# Patient Record
Sex: Male | Born: 1990 | Race: White | Hispanic: No | Marital: Single | Smoking: Current every day smoker
Health system: Southern US, Community
[De-identification: ages and names within clinical notes are randomized; demographics above are authoritative.]

## PROBLEM LIST (undated history)

## (undated) DIAGNOSIS — J189 Pneumonia, unspecified organism: Secondary | ICD-10-CM

## (undated) DIAGNOSIS — F909 Attention-deficit hyperactivity disorder, unspecified type: Secondary | ICD-10-CM

## (undated) DIAGNOSIS — F419 Anxiety disorder, unspecified: Secondary | ICD-10-CM

## (undated) DIAGNOSIS — F329 Major depressive disorder, single episode, unspecified: Secondary | ICD-10-CM

## (undated) DIAGNOSIS — F32A Depression, unspecified: Secondary | ICD-10-CM

## (undated) DIAGNOSIS — K759 Inflammatory liver disease, unspecified: Secondary | ICD-10-CM

## (undated) DIAGNOSIS — Z87442 Personal history of urinary calculi: Secondary | ICD-10-CM

## (undated) DIAGNOSIS — F1910 Other psychoactive substance abuse, uncomplicated: Secondary | ICD-10-CM

## (undated) HISTORY — PX: OTHER SURGICAL HISTORY: SHX169

## (undated) HISTORY — DX: Other psychoactive substance abuse, uncomplicated: F19.10

---
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
## 2012-04-17 ENCOUNTER — Encounter (HOSPITAL_COMMUNITY): Payer: Self-pay | Admitting: *Deleted

## 2012-04-17 ENCOUNTER — Emergency Department (HOSPITAL_COMMUNITY)
Admission: EM | Admit: 2012-04-17 | Discharge: 2012-04-17 | Disposition: A | Discharge status: Home / Self Care | Payer: Medicaid Other | Attending: Emergency Medicine | Admitting: Emergency Medicine

## 2012-04-17 DIAGNOSIS — S39012A Strain of muscle, fascia and tendon of lower back, initial encounter: Secondary | ICD-10-CM

## 2012-04-17 DIAGNOSIS — F17200 Nicotine dependence, unspecified, uncomplicated: Secondary | ICD-10-CM | POA: Insufficient documentation

## 2012-04-17 DIAGNOSIS — M545 Low back pain, unspecified: Secondary | ICD-10-CM | POA: Insufficient documentation

## 2012-04-17 DIAGNOSIS — X500XXA Overexertion from strenuous movement or load, initial encounter: Secondary | ICD-10-CM | POA: Insufficient documentation

## 2012-04-17 DIAGNOSIS — S335XXA Sprain of ligaments of lumbar spine, initial encounter: Secondary | ICD-10-CM | POA: Insufficient documentation

## 2012-04-17 MED ORDER — IBUPROFEN 800 MG PO TABS: 800.0000 mg | ORAL_TABLET | Freq: Three times a day (TID) | ORAL | Status: AC

## 2012-04-17 MED ORDER — OXYCODONE-ACETAMINOPHEN 5-325 MG PO TABS: 1.0000 | ORAL_TABLET | ORAL | Status: AC | PRN

## 2012-04-17 MED ORDER — OXYCODONE-ACETAMINOPHEN 5-325 MG PO TABS
1.0000 | ORAL_TABLET | Freq: Once | ORAL | Status: AC
  Administered 2012-04-17: 1 via ORAL
  Filled 2012-04-17: qty 1

## 2012-04-17 MED ORDER — CYCLOBENZAPRINE HCL 10 MG PO TABS: 10.0000 mg | ORAL_TABLET | Freq: Three times a day (TID) | ORAL | Status: DC | PRN

## 2012-04-17 MED ORDER — CYCLOBENZAPRINE HCL 10 MG PO TABS
10.0000 mg | ORAL_TABLET | Freq: Once | ORAL | Status: AC
  Administered 2012-04-17: 10 mg via ORAL
  Filled 2012-04-17: qty 1

## 2012-04-17 NOTE — ED Provider Notes (Signed)
History     CSN: [id number]  Arrival date & time 04/17/12  2206   First MD Initiated Contact with Patient 04/17/12 2253      Chief Complaint  Patient presents with  . Back Pain    (Consider location/radiation/quality/duration/timing/severity/associated sxs/prior treatment) HPI Comments: Patient c/o pain to his lower back for several hours after pushing a car up a hill.  States the pain improves somewhat with rest, but has "sharp shooting pains" with movement.  He denies numbness, weakness, or incontinence.    Patient is a 21 y.o. male presenting with back pain. The history is provided by the patient.  Back Pain  This is a new problem. The current episode started 3 to 5 hours ago. The problem occurs constantly. The problem has been gradually worsening. Associated with: pushing a vehicle up a hill. The pain is present in the lumbar spine. The quality of the pain is described as shooting and aching. The pain does not radiate. The pain is moderate. The symptoms are aggravated by bending, twisting and certain positions. The pain is the same all the time. Pertinent negatives include no chest pain, no fever, no numbness, no abdominal pain, no abdominal swelling, no bowel incontinence, no perianal numbness, no bladder incontinence, no dysuria, no pelvic pain, no leg pain, no paresthesias, no paresis, no tingling and no weakness. He has tried nothing for the symptoms. The treatment provided no relief.    History reviewed. No pertinent past medical history.  History reviewed. No pertinent past surgical history.  History reviewed. No pertinent family history.  History  Substance Use Topics  . Smoking status: Current Everyday Smoker    Types: Cigarettes  . Smokeless tobacco: Not on file  . Alcohol Use: Yes      Review of Systems  Constitutional: Negative for fever.  Respiratory: Negative for shortness of breath.   Cardiovascular: Negative for chest pain.  Gastrointestinal: Negative for  vomiting, abdominal pain, constipation and bowel incontinence.  Genitourinary: Negative for bladder incontinence, dysuria, hematuria, flank pain, decreased urine volume, difficulty urinating and pelvic pain.       Also negative for perineal numbness and incontinence of urine or feces  Musculoskeletal: Positive for back pain. Negative for joint swelling.  Skin: Negative for rash.  Neurological: Negative for tingling, weakness, numbness and paresthesias.  All other systems reviewed and are negative.    Allergies  Hydrocodone  Home Medications  No current outpatient prescriptions on file.  BP 113/71  Pulse 83  Temp(Src) 97.9 F (36.6 C) (Oral)  Resp 24  Ht 5\' 10"  (1.778 m)  Wt 140 lb (63.504 kg)  BMI 20.09 kg/m2  SpO2 99%  Physical Exam  Nursing note and vitals reviewed. Constitutional: He is oriented to person, place, and time. He appears well-developed and well-nourished. No distress.  HENT:  Head: Normocephalic and atraumatic.  Neck: Normal range of motion. Neck supple.  Cardiovascular: Normal rate, regular rhythm and normal heart sounds.   No murmur heard. Pulmonary/Chest: Effort normal and breath sounds normal. No respiratory distress.  Abdominal: Soft. He exhibits no distension. There is no tenderness.  Musculoskeletal: Normal range of motion. He exhibits tenderness. He exhibits no edema.       Lumbar back: He exhibits tenderness, edema, pain and spasm. He exhibits normal range of motion, no bony tenderness, no swelling and normal pulse.       Back:       ttp of the lumbar paraspinal muscles.    Lymphadenopathy:  He has no cervical adenopathy.  Neurological: He is alert and oriented to person, place, and time. No cranial nerve deficit or sensory deficit. He exhibits normal muscle tone. Coordination and gait normal.  Reflex Scores:      Patellar reflexes are 2+ on the right side and 2+ on the left side.      Achilles reflexes are 2+ on the right side and 2+ on the  left side. Skin: Skin is warm and dry.    ED Course  Procedures (including critical care time)       MDM     Previous medical charts, nursing notes and vitals signs from this visit were reviewed by me   All laboratory results and/or imaging results performed on this visit, if applicable, were reviewed by me and discussed with the patient and/or parent as well as recommendation for follow-up    MEDICATIONS GIVEN IN ED:    Po percocet and flexeril   Patient has tenderness to palpation of the lumbar paraspinal muscles. Pain is also reproduced with bilateral straight-leg raise. No focal neuro deficits on exam. Patient ambulates with a steady gait. He agrees to followup with his primary care physician.      PRESCRIPTIONS GIVEN AT DISCHARGE:  Percocet, flexeril     Pt stable in ED with no significant deterioration in condition. Pt feels improved after observation and/or treatment in ED. Patient / Family / Caregiver understand and agree with initial ED impression and plan with expectations set for ED visit.  Patient agrees to return to ED for any worsening symptoms       Evian Derringer L. [place], [place] 04/17/12 2324

## 2012-04-17 NOTE — ED Notes (Signed)
Back pain after pushing a car off the road.

## 2012-04-17 NOTE — Discharge Instructions (Signed)

## 2012-04-18 NOTE — ED Provider Notes (Signed)
Medical screening examination/treatment/procedure(s) were performed by non-physician practitioner and as supervising physician I was immediately available for consultation/collaboration.   Vida Roller, MD 04/18/12 [phone]

## 2012-04-22 ENCOUNTER — Emergency Department (HOSPITAL_COMMUNITY): Payer: Self-pay

## 2012-04-22 ENCOUNTER — Emergency Department (HOSPITAL_COMMUNITY)
Admission: EM | Admit: 2012-04-22 | Discharge: 2012-04-22 | Disposition: A | Discharge status: Home / Self Care | Payer: Self-pay | Attending: Emergency Medicine | Admitting: Emergency Medicine

## 2012-04-22 ENCOUNTER — Encounter (HOSPITAL_COMMUNITY): Payer: Self-pay | Admitting: *Deleted

## 2012-04-22 DIAGNOSIS — S335XXA Sprain of ligaments of lumbar spine, initial encounter: Secondary | ICD-10-CM | POA: Insufficient documentation

## 2012-04-22 DIAGNOSIS — S39012A Strain of muscle, fascia and tendon of lower back, initial encounter: Secondary | ICD-10-CM

## 2012-04-22 DIAGNOSIS — X503XXA Overexertion from repetitive movements, initial encounter: Secondary | ICD-10-CM | POA: Insufficient documentation

## 2012-04-22 DIAGNOSIS — M545 Low back pain, unspecified: Secondary | ICD-10-CM | POA: Insufficient documentation

## 2012-04-22 DIAGNOSIS — M533 Sacrococcygeal disorders, not elsewhere classified: Secondary | ICD-10-CM | POA: Insufficient documentation

## 2012-04-22 MED ORDER — OXYCODONE-ACETAMINOPHEN 5-325 MG PO TABS
ORAL_TABLET | ORAL | Status: AC
Start: 2012-04-22 — End: 2012-05-02

## 2012-04-22 MED ORDER — DIAZEPAM 5 MG PO TABS
5.0000 mg | ORAL_TABLET | Freq: Once | ORAL | Status: AC
  Administered 2012-04-22: 5 mg via ORAL
  Filled 2012-04-22: qty 1

## 2012-04-22 MED ORDER — OXYCODONE-ACETAMINOPHEN 5-325 MG PO TABS
1.0000 | ORAL_TABLET | Freq: Once | ORAL | Status: AC
  Administered 2012-04-22: 1 via ORAL
  Filled 2012-04-22: qty 1

## 2012-04-22 MED ORDER — METAXALONE 800 MG PO TABS: 800.0000 mg | ORAL_TABLET | Freq: Three times a day (TID) | ORAL | Status: AC

## 2012-04-22 NOTE — ED Notes (Signed)
Pt was seen in er on Monday for the same. Out of meds & was told to return if not improved.

## 2012-04-22 NOTE — ED Provider Notes (Signed)
History    Scribed for Larry Anger, DO, the patient was seen in room APA19/APA19. This chart was scribed by Katha Cabal.   CSN: [id number]  Arrival date & time 04/22/12  2056   First MD Initiated Contact with Patient 04/22/12 2107      Chief Complaint  Patient presents with  . Back Pain     HPI Dr. Clarene Duke entered patient's room at 2110. Larry Wilson is a 21 y.o. male who presents to the Emergency Department complaining of gradual onset and persistence of constant lower back "pain" for the past 5 days. Pt states the pain began after he was "pushing a car up a hill" 5 days ago.  Pt was eval in the ED for same, rx flexeril and percocet with improvement of pain.  Has run out of those rx and the pain has returned.  States he took OTC motrin without relief.  Pain worsens with palpation of the area and certain body positions.  Denies incont/retention of bowel or bladder, no saddle anesthesia, no focal motor weakness, no tingling/numbness in extremities, no fevers, no direct or new injury.   The symptoms have been associated with no other complaints. The patient has a significant history of similar symptoms previously, recently being evaluated for this complaint.   PCP Dr Milford Cage   History reviewed. No pertinent past medical history.  History reviewed. No pertinent past surgical history.   History  Substance Use Topics  . Smoking status: Current Everyday Smoker    Types: Cigarettes  . Smokeless tobacco: Not on file  . Alcohol Use: Yes    Review of Systems ROS: Statement: All systems negative except as marked or noted in the HPI; Constitutional: Negative for fever and chills. ; ; Eyes: Negative for eye pain, redness and discharge. ; ; ENMT: Negative for ear pain, hoarseness, nasal congestion, sinus pressure and sore throat. ; ; Cardiovascular: Negative for chest pain, palpitations, diaphoresis, dyspnea and peripheral edema. ; ; Respiratory: Negative for cough, wheezing and  stridor. ; ; Gastrointestinal: Negative for nausea, vomiting, diarrhea, abdominal pain, blood in stool, hematemesis, jaundice and rectal bleeding. . ; ; Genitourinary: Negative for dysuria, flank pain and hematuria. ; ; Musculoskeletal: +LBP.  Negative for neck pain. Negative for swelling and trauma.; ; Skin: Negative for pruritus, rash, abrasions, blisters, bruising and skin lesion.; ; Neuro: Negative for headache, lightheadedness and neck stiffness. Negative for weakness, altered level of consciousness , altered mental status, extremity weakness, paresthesias, involuntary movement, seizure and syncope.      Allergies  Hydrocodone  Home Medications   Current Outpatient Rx  Name Route Sig Dispense Refill  . CYCLOBENZAPRINE HCL 10 MG PO TABS Oral Take 1 tablet (10 mg total) by mouth 3 (three) times daily as needed for muscle spasms. 21 tablet 0  . IBUPROFEN 800 MG PO TABS Oral Take 1 tablet (800 mg total) by mouth 3 (three) times daily. 21 tablet 0  . OXYCODONE-ACETAMINOPHEN 5-325 MG PO TABS Oral Take 1 tablet by mouth every 4 (four) hours as needed for pain. 12 tablet 0    BP 134/71  Pulse 88  Temp(Src) 98.9 F (37.2 C) (Oral)  Resp 16  Ht 5\' 10"  (1.778 m)  Wt 140 lb (63.504 kg)  BMI 20.09 kg/m2  SpO2 100%  Physical Exam 2115: Physical examination:  Nursing notes reviewed; Vital signs and O2 SAT reviewed;  Constitutional: Well developed, Well nourished, Well hydrated, In no acute distress; Head:  Normocephalic, atraumatic; Eyes: EOMI, PERRL,  No scleral icterus; ENMT: Mouth and pharynx normal, Mucous membranes moist; Neck: Supple, Full range of motion, No lymphadenopathy; Cardiovascular: Regular rate and rhythm, No murmur, rub, or gallop; Respiratory: Breath sounds clear & equal bilaterally, No rales, rhonchi, wheezes, or rub, Normal respiratory effort/excursion; Chest: Nontender, Movement normal; Abdomen: Soft, Nontender, Nondistended, Normal bowel sounds; Genitourinary: No CVA  tenderness; Spine:  No midline CS, TS, LS tenderness.  +TTP right lumbar paraspinal muscles and left SI joint areas.; Extremities: Pulses normal, No tenderness, No edema, No calf edema or asymmetry.; Neuro: AA&Ox3, Major CN grossly intact. Strength 5/5 equal bilat UE's and LE's, including great toe dorsiflexion.  DTR 2/4 equal bilat UE's and LE's.  No gross sensory deficits. Gait steady. Neg straight leg raises bilat..; Skin: Color normal, Warm, Dry   ED Course  Procedures    MDM  MDM Reviewed: nursing note, vitals and previous chart Interpretation: x-ray     Dg Lumbar Spine Complete 04/22/2012  *RADIOLOGY REPORT*  Clinical Data: Lower back pain after pushing car uphill.  LUMBAR SPINE - COMPLETE 4+ VIEW  Comparison: Lumbar spine radiographs performed 02/15/2011  Findings: There is no evidence of fracture or subluxation. Vertebral bodies demonstrate normal height and alignment. Intervertebral disc spaces are preserved.  The visualized neural foramina are grossly unremarkable in appearance.  The visualized bowel gas pattern is unremarkable in appearance; air and stool are noted within the colon.  The sacroiliac joints are within normal limits.  IMPRESSION: No evidence of fracture or subluxation along the lumbar spine.  Original Report Authenticated By: Tonia Ghent, M.D.     2240:  Pt has been ambulatory around the ED with steady upright gait.  Appears msk pain at this time, will tx symptomatically.  Dx testing d/w pt and family.  Questions answered.  Verb understanding, agreeable to d/c home with outpt f/u.         I personally performed the services described in this documentation, which was scribed in my presence. The recorded information has been reviewed and considered. Maher Shon Allison Quarry, DO 04/24/12 0132

## 2012-04-22 NOTE — Discharge Instructions (Signed)
RESOURCE GUIDE  Dental Problems  Patients with Medicaid: [facility]                     [facility] [address]                                           [address] Phone:  [phone]                                                  Phone:  [phone]  If unable to pay or uninsured, contact:  [facility] or [facility] to become qualified for the adult dental clinic.  Chronic Pain Problems Contact [facility]  [phone] Patients need to be referred by their primary care doctor.  Insufficient Money for Medicine Contact [employer]:  call "211" or [facility] 271-5999.  No Primary Care Doctor Call [facility]  832-8000 Other agencies that provide inexpensive medical care    [facility]  [phone]    [facility]  [phone]    [facility]  [phone]    Women's Clinic  [phone]    [facility]  [phone]    [facility]  [phone]  Psychological Services [facility]  [phone] [facility]  [phone] [facility]   800 853-5163 (emergency services 641-4993)  Substance Abuse Resources [facility]  336-882-2125 [facility] 336-784-9470 [facility] 336-285-9073 [facility] 336-845-3988 Residential & Outpatient Substance Abuse Program  800-659-3381  Abuse/Neglect [place] Child Abuse Hotline (336) 641-3795 [place] Child Abuse Hotline 800-378-5315 (After Hours)  Emergency Shelter [facility] (336) 271-5985  Maternity Homes [facility] (336) 275-9566 [facility] (704) 372-4663  MRSA Hotline #:   832-7006    [place] Resources  [facility] [place]     [employer]                          [facility] [address] [place]                       [address]      [address]  [place]                                                [place]                            [place] Phone:  [phone]                                   Phone:  [phone]                 Phone:  [phone]  [facility] Phone:  [phone]    [facility] Child Abuse Hotline 781-259-3587 740-435-1735 (After Hours)   Take the prescriptions as directed.  Continue to take over the counter ibuprofen, as directed on packaging, with food, for the next week.  Apply moist heat or ice to the area(s) of discomfort, for 15 minutes at a time, several times per day for the next few days.  Do not fall asleep on a heating or ice pack.  Call your regular medical doctor on Monday to schedule a follow up appointment this week.  Return to the Emergency Department immediately if worsening.

## 2012-04-22 NOTE — ED Notes (Signed)
Pt alert & oriented x4, stable gait. Pt given discharge instructions, paperwork & prescription(s). Patient instructed to stop at the registration desk to finish any additional paperwork. pt verbalized understanding. Pt left department w/ no further questions.  

## 2012-08-29 ENCOUNTER — Encounter (HOSPITAL_COMMUNITY): Payer: Self-pay | Admitting: *Deleted

## 2012-08-29 ENCOUNTER — Emergency Department (HOSPITAL_COMMUNITY)
Admission: EM | Admit: 2012-08-29 | Discharge: 2012-08-29 | Disposition: A | Discharge status: Home / Self Care | Payer: Self-pay | Attending: Emergency Medicine | Admitting: Emergency Medicine

## 2012-08-29 DIAGNOSIS — F17200 Nicotine dependence, unspecified, uncomplicated: Secondary | ICD-10-CM | POA: Insufficient documentation

## 2012-08-29 DIAGNOSIS — A549 Gonococcal infection, unspecified: Secondary | ICD-10-CM

## 2012-08-29 DIAGNOSIS — A5400 Gonococcal infection of lower genitourinary tract, unspecified: Secondary | ICD-10-CM | POA: Insufficient documentation

## 2012-08-29 MED ORDER — CEFTRIAXONE SODIUM 250 MG IJ SOLR
250.0000 mg | Freq: Once | INTRAMUSCULAR | Status: AC
  Administered 2012-08-29: 250 mg via INTRAMUSCULAR
  Filled 2012-08-29: qty 250

## 2012-08-29 MED ORDER — LIDOCAINE HCL (PF) 1 % IJ SOLN
INTRAMUSCULAR | Status: AC
  Filled 2012-08-29: qty 5

## 2012-08-29 MED ORDER — AZITHROMYCIN 250 MG PO TABS
1000.0000 mg | ORAL_TABLET | Freq: Once | ORAL | Status: AC
  Administered 2012-08-29: 1000 mg via ORAL
  Filled 2012-08-29: qty 4

## 2012-08-29 NOTE — ED Notes (Signed)
Pt reports that he "met a woman" while at the beach, now has milky white discharge,

## 2012-08-29 NOTE — ED Notes (Signed)
Pt with unprotected sex with a girl over a week, started with burning on urination and white discharge, denies any rashes or sores

## 2012-08-29 NOTE — ED Provider Notes (Signed)
History     CSN: [id number]  Arrival date & time 08/29/12  1408   None     Chief Complaint  Patient presents with  . Exposure to STD    (Consider location/radiation/quality/duration/timing/severity/associated sxs/prior treatment) HPI Comments: Pt had unprotected sex ~ 5 days ago.  He has been having white penile d/c and dysuria ~ 3 days.   No other sxs.  Patient is a 21 y.o. male presenting with STD exposure. The history is provided by the patient. No language interpreter was used.  Exposure to STD This is a new problem. Episode onset: 3 days ago. The problem occurs constantly. The problem has been unchanged. Pertinent negatives include no fever, joint swelling, myalgias or rash. He has tried nothing for the symptoms.    History reviewed. No pertinent past medical history.  History reviewed. No pertinent past surgical history.  No family history on file.  History  Substance Use Topics  . Smoking status: Current Everyday Smoker    Types: Cigarettes  . Smokeless tobacco: Not on file  . Alcohol Use: No      Review of Systems  Constitutional: Negative for fever.  Genitourinary: Positive for dysuria. Negative for urgency, frequency, hematuria, flank pain, penile swelling, scrotal swelling, genital sores and testicular pain.  Musculoskeletal: Negative for myalgias and joint swelling.  Skin: Negative for rash.  All other systems reviewed and are negative.    Allergies  Flexeril and Hydrocodone  Home Medications  No current outpatient prescriptions on file.  BP 137/83  Pulse 58  Temp 98.3 F (36.8 C) (Oral)  Resp 20  SpO2 100%  Physical Exam  Nursing note and vitals reviewed. Constitutional: He is oriented to person, place, and time. He appears well-developed and well-nourished.  HENT:  Head: Normocephalic and atraumatic.  Eyes: EOM are normal.  Neck: Normal range of motion.  Cardiovascular: Normal rate, regular rhythm, normal heart sounds and intact distal  pulses.   Pulmonary/Chest: Effort normal and breath sounds normal. No respiratory distress.  Abdominal: Soft. He exhibits no distension. There is no tenderness.  Genitourinary: Testes normal. Circumcised. No phimosis, paraphimosis, hypospadias, penile erythema or penile tenderness. Discharge found.       Thick white penile discharge easily milked from the meatus.  Musculoskeletal: Normal range of motion.  Neurological: He is alert and oriented to person, place, and time.  Skin: Skin is warm and dry.  Psychiatric: He has a normal mood and affect. Judgment normal.    ED Course  Procedures (including critical care time)   Labs Reviewed  GC/CHLAMYDIA PROBE AMP, GENITAL  RPR   No results found.   1. Gonorrhea       MDM  Rocephin 250 mg IM zithromax 1 gm po RPR pending.        Evalina Field, [place] 08/29/12 1645

## 2012-08-30 LAB — GC/CHLAMYDIA PROBE AMP, GENITAL
Chlamydia, DNA Probe: NEGATIVE
GC Probe Amp, Genital: NEGATIVE

## 2012-08-30 LAB — RPR: RPR Ser Ql: NONREACTIVE

## 2012-08-31 NOTE — ED Provider Notes (Signed)
Medical screening examination/treatment/procedure(s) were performed by non-physician practitioner and as supervising physician I was immediately available for consultation/collaboration.  Dezeray Puccio, MD 08/31/12 0525 

## 2013-01-06 ENCOUNTER — Emergency Department (HOSPITAL_COMMUNITY)
Admission: EM | Admit: 2013-01-06 | Discharge: 2013-01-06 | Disposition: A | Discharge status: Home / Self Care | Payer: No Typology Code available for payment source | Attending: Emergency Medicine | Admitting: Emergency Medicine

## 2013-01-06 ENCOUNTER — Encounter (HOSPITAL_COMMUNITY): Payer: Self-pay | Admitting: *Deleted

## 2013-01-06 ENCOUNTER — Emergency Department (HOSPITAL_COMMUNITY): Payer: No Typology Code available for payment source

## 2013-01-06 DIAGNOSIS — S161XXA Strain of muscle, fascia and tendon at neck level, initial encounter: Secondary | ICD-10-CM

## 2013-01-06 DIAGNOSIS — Y9229 Other specified public building as the place of occurrence of the external cause: Secondary | ICD-10-CM | POA: Insufficient documentation

## 2013-01-06 DIAGNOSIS — S139XXA Sprain of joints and ligaments of unspecified parts of neck, initial encounter: Secondary | ICD-10-CM | POA: Insufficient documentation

## 2013-01-06 DIAGNOSIS — F17200 Nicotine dependence, unspecified, uncomplicated: Secondary | ICD-10-CM | POA: Insufficient documentation

## 2013-01-06 DIAGNOSIS — Y9389 Activity, other specified: Secondary | ICD-10-CM | POA: Insufficient documentation

## 2013-01-06 MED ORDER — NAPROXEN 500 MG PO TABS: 500.0000 mg | ORAL_TABLET | Freq: Two times a day (BID) | ORAL | Status: DC

## 2013-01-06 MED ORDER — METHOCARBAMOL 500 MG PO TABS: ORAL_TABLET | ORAL | Status: DC
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
## 2013-01-10 NOTE — ED Provider Notes (Signed)
Medical screening examination/treatment/procedure(s) were performed by non-physician practitioner and as supervising physician I was immediately available for consultation/collaboration.   Ajahni Nay W Sarahanne Novakowski, MD 01/10/13 1750 

## 2013-06-24 ENCOUNTER — Emergency Department (HOSPITAL_COMMUNITY)
Admission: EM | Admit: 2013-06-24 | Discharge: 2013-06-24 | Disposition: A | Discharge status: Home / Self Care | Payer: No Typology Code available for payment source | Attending: Emergency Medicine | Admitting: Emergency Medicine

## 2013-06-24 ENCOUNTER — Encounter (HOSPITAL_COMMUNITY): Payer: Self-pay | Admitting: Emergency Medicine

## 2013-06-24 ENCOUNTER — Emergency Department (HOSPITAL_COMMUNITY): Payer: No Typology Code available for payment source

## 2013-06-24 DIAGNOSIS — Y929 Unspecified place or not applicable: Secondary | ICD-10-CM | POA: Insufficient documentation

## 2013-06-24 DIAGNOSIS — S62111A Displaced fracture of triquetrum [cuneiform] bone, right wrist, initial encounter for closed fracture: Secondary | ICD-10-CM

## 2013-06-24 DIAGNOSIS — Y939 Activity, unspecified: Secondary | ICD-10-CM | POA: Insufficient documentation

## 2013-06-24 DIAGNOSIS — IMO0002 Reserved for concepts with insufficient information to code with codable children: Secondary | ICD-10-CM | POA: Insufficient documentation

## 2013-06-24 DIAGNOSIS — Z8659 Personal history of other mental and behavioral disorders: Secondary | ICD-10-CM | POA: Insufficient documentation

## 2013-06-24 DIAGNOSIS — F17200 Nicotine dependence, unspecified, uncomplicated: Secondary | ICD-10-CM | POA: Insufficient documentation

## 2013-06-24 DIAGNOSIS — S62113A Displaced fracture of triquetrum [cuneiform] bone, unspecified wrist, initial encounter for closed fracture: Secondary | ICD-10-CM | POA: Insufficient documentation

## 2013-06-24 HISTORY — DX: Attention-deficit hyperactivity disorder, unspecified type: F90.9

## 2013-06-24 MED ORDER — OXYCODONE-ACETAMINOPHEN 5-325 MG PO TABS
1.0000 | ORAL_TABLET | Freq: Once | ORAL | Status: AC
  Administered 2013-06-24: 1 via ORAL
  Filled 2013-06-24: qty 1

## 2013-06-24 MED ORDER — OXYCODONE-ACETAMINOPHEN 5-325 MG PO TABS: 1.0000 | ORAL_TABLET | ORAL | Status: DC | PRN

## 2013-06-24 MED ORDER — IBUPROFEN 800 MG PO TABS: 800.0000 mg | ORAL_TABLET | Freq: Three times a day (TID) | ORAL | Status: DC

## 2013-06-24 NOTE — ED Provider Notes (Signed)
Medical screening examination/treatment/procedure(s) were performed by non-physician practitioner and as supervising physician I was immediately available for consultation/collaboration.   Shelda Jakes, MD 06/24/13 2006
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
## 2013-11-26 ENCOUNTER — Encounter (HOSPITAL_COMMUNITY): Payer: Self-pay | Admitting: Emergency Medicine

## 2013-11-26 ENCOUNTER — Emergency Department (HOSPITAL_COMMUNITY)
Admission: EM | Admit: 2013-11-26 | Discharge: 2013-11-26 | Disposition: A | Discharge status: Home / Self Care | Payer: Self-pay | Attending: Emergency Medicine | Admitting: Emergency Medicine

## 2013-11-26 DIAGNOSIS — G8929 Other chronic pain: Secondary | ICD-10-CM | POA: Insufficient documentation

## 2013-11-26 DIAGNOSIS — M546 Pain in thoracic spine: Secondary | ICD-10-CM | POA: Insufficient documentation

## 2013-11-26 DIAGNOSIS — R42 Dizziness and giddiness: Secondary | ICD-10-CM | POA: Insufficient documentation

## 2013-11-26 DIAGNOSIS — Z8659 Personal history of other mental and behavioral disorders: Secondary | ICD-10-CM | POA: Insufficient documentation

## 2013-11-26 DIAGNOSIS — R6883 Chills (without fever): Secondary | ICD-10-CM | POA: Insufficient documentation

## 2013-11-26 DIAGNOSIS — F17200 Nicotine dependence, unspecified, uncomplicated: Secondary | ICD-10-CM | POA: Insufficient documentation

## 2013-11-26 DIAGNOSIS — Z87828 Personal history of other (healed) physical injury and trauma: Secondary | ICD-10-CM | POA: Insufficient documentation

## 2013-11-26 DIAGNOSIS — M25519 Pain in unspecified shoulder: Secondary | ICD-10-CM | POA: Insufficient documentation

## 2013-11-26 MED ORDER — METHOCARBAMOL 500 MG PO TABS: 500.0000 mg | ORAL_TABLET | Freq: Two times a day (BID) | ORAL | Status: DC

## 2013-11-26 MED ORDER — NAPROXEN 500 MG PO TABS: 500.0000 mg | ORAL_TABLET | Freq: Two times a day (BID) | ORAL | Status: DC

## 2013-11-26 MED ORDER — OXYCODONE-ACETAMINOPHEN 5-325 MG PO TABS
1.0000 | ORAL_TABLET | Freq: Once | ORAL | Status: AC
  Administered 2013-11-26: 1 via ORAL
  Filled 2013-11-26: qty 1
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
## 2013-11-26 NOTE — ED Notes (Signed)
Pain under lt scapula for 3 years. Says he has been seen here for same and the pain always comes back.  Last 3 days the pain in worse

## 2013-11-26 NOTE — ED Notes (Addendum)
Pt reporting back pain, under left shoulder, for past several days. Pt has experienced pain in same area previously.  Reports no relief from 800 mg Ibuprofen taken earlier, or Tramadol taken last night.

## 2013-11-28 NOTE — ED Provider Notes (Signed)
Medical screening examination/treatment/procedure(s) were performed by non-physician practitioner and as supervising physician I was immediately available for consultation/collaboration.  EKG Interpretation   None         Chareese Sergent M Urijah Raynor, DO 11/28/13 1735 

## 2014-01-12 ENCOUNTER — Emergency Department (HOSPITAL_COMMUNITY): Payer: Self-pay

## 2014-01-12 ENCOUNTER — Emergency Department (HOSPITAL_COMMUNITY)
Admission: EM | Admit: 2014-01-12 | Discharge: 2014-01-12 | Disposition: A | Discharge status: Home / Self Care | Payer: Self-pay | Attending: Emergency Medicine | Admitting: Emergency Medicine

## 2014-01-12 ENCOUNTER — Encounter (HOSPITAL_COMMUNITY): Payer: Self-pay | Admitting: Emergency Medicine

## 2014-01-12 DIAGNOSIS — Z8659 Personal history of other mental and behavioral disorders: Secondary | ICD-10-CM | POA: Insufficient documentation

## 2014-01-12 DIAGNOSIS — J209 Acute bronchitis, unspecified: Secondary | ICD-10-CM | POA: Insufficient documentation

## 2014-01-12 DIAGNOSIS — F17200 Nicotine dependence, unspecified, uncomplicated: Secondary | ICD-10-CM | POA: Insufficient documentation

## 2014-01-12 DIAGNOSIS — R5381 Other malaise: Secondary | ICD-10-CM | POA: Insufficient documentation

## 2014-01-12 DIAGNOSIS — Z792 Long term (current) use of antibiotics: Secondary | ICD-10-CM | POA: Insufficient documentation

## 2014-01-12 DIAGNOSIS — R5383 Other fatigue: Secondary | ICD-10-CM

## 2014-01-12 MED ORDER — BENZONATATE 100 MG PO CAPS
200.0000 mg | ORAL_CAPSULE | Freq: Once | ORAL | Status: AC
  Administered 2014-01-12: 200 mg via ORAL
  Filled 2014-01-12: qty 2

## 2014-01-12 MED ORDER — AZITHROMYCIN 250 MG PO TABS
500.0000 mg | ORAL_TABLET | Freq: Once | ORAL | Status: AC
  Administered 2014-01-12: 500 mg via ORAL
  Filled 2014-01-12: qty 2

## 2014-01-12 MED ORDER — PROMETHAZINE-CODEINE 6.25-10 MG/5ML PO SYRP: 5.0000 mL | ORAL_SOLUTION | ORAL | Status: DC | PRN

## 2014-01-12 MED ORDER — BENZONATATE 100 MG PO CAPS: 200.0000 mg | ORAL_CAPSULE | Freq: Three times a day (TID) | ORAL | Status: DC | PRN

## 2014-01-12 MED ORDER — HYDROCOD POLST-CHLORPHEN POLST 10-8 MG/5ML PO LQCR: 5.0000 mL | Freq: Two times a day (BID) | ORAL | Status: DC | PRN

## 2014-01-12 MED ORDER — AZITHROMYCIN 250 MG PO TABS: ORAL_TABLET | ORAL | Status: DC

## 2014-01-12 NOTE — Discharge Instructions (Signed)
Acute Bronchitis Bronchitis is inflammation of the airways that extend from the windpipe into the lungs (bronchi). The inflammation often causes mucus to develop. This leads to a cough, which is the most common symptom of bronchitis.  In acute bronchitis, the condition usually develops suddenly and goes away over time, usually in a couple weeks. Smoking, allergies, and asthma can make bronchitis worse. Repeated episodes of bronchitis may cause further lung problems.  CAUSES Acute bronchitis is most often caused by the same virus that causes a cold. The virus can spread from person to person (contagious).  SIGNS AND SYMPTOMS   Cough.   Fever.   Coughing up mucus.   Body aches.   Chest congestion.   Chills.   Shortness of breath.   Sore throat.  DIAGNOSIS  Acute bronchitis is usually diagnosed through a physical exam. Tests, such as chest X-rays, are sometimes done to rule out other conditions.  TREATMENT  Acute bronchitis usually goes away in a couple weeks. Often times, no medical treatment is necessary. Medicines are sometimes given for relief of fever or cough. Antibiotics are usually not needed but may be prescribed in certain situations. In some cases, an inhaler may be recommended to help reduce shortness of breath and control the cough. A cool mist vaporizer may also be used to help thin bronchial secretions and make it easier to clear the chest.  HOME CARE INSTRUCTIONS  Get plenty of rest.   Drink enough fluids to keep your urine clear or pale yellow (unless you have a medical condition that requires fluid restriction). Increasing fluids may help thin your secretions and will prevent dehydration.   Only take over-the-counter or prescription medicines as directed by your health care provider.   Avoid smoking and secondhand smoke. Exposure to cigarette smoke or irritating chemicals will make bronchitis worse. If you are a smoker, consider using nicotine gum or skin  patches to help control withdrawal symptoms. Quitting smoking will help your lungs heal faster.   Reduce the chances of another bout of acute bronchitis by washing your hands frequently, avoiding people with cold symptoms, and trying not to touch your hands to your mouth, nose, or eyes.   Follow up with your health care provider as directed.  SEEK MEDICAL CARE IF: Your symptoms do not improve after 1 week of treatment.  SEEK IMMEDIATE MEDICAL CARE IF:  You develop an increased fever or chills.   You have chest pain.   You have severe shortness of breath.  You have bloody sputum.   You develop dehydration.  You develop fainting.  You develop repeated vomiting.  You develop a severe headache. MAKE SURE YOU:   Understand these instructions.  Will watch your condition.  Will get help right away if you are not doing well or get worse. Document Released: 01/13/2005 Document Revised: 08/08/2013 Document Reviewed: 05/29/2013 [employer] Patient Information 2014 [employer].  

## 2014-01-12 NOTE — ED Notes (Signed)
Pt c/o fever, cough, sore throat x3 days. Cough is productive with green sputum.

## 2014-01-15 NOTE — ED Provider Notes (Signed)
CSN: [id number]     Arrival date & time 01/12/14  1710 History   First MD Initiated Contact with Patient 01/12/14 1728     Chief Complaint  Patient presents with  . Fever  . Cough   (Consider location/radiation/quality/duration/timing/severity/associated sxs/prior Treatment) HPI Comments: Larry Wilson is a 23 y.o. Male presenting with a 3 day history of  uri type symptoms which includes nasal congestion with fever and chills, clear rhinorrhea,fatigue, cough productive of green sputum and sore throat. His symptoms started gradually over the past 3 days. Symptoms due to not include shortness of breath, chest pain,  Nausea, vomiting or diarrhea.  The patient has taken ibuprofen with minimal symptom relief.  The history is provided by the patient.    Past Medical History  Diagnosis Date  . ADHD (attention deficit hyperactivity disorder)    History reviewed. No pertinent past surgical history. History reviewed. No pertinent family history. History  Substance Use Topics  . Smoking status: Current Every Day Smoker -- 1.00 packs/day    Types: Cigarettes  . Smokeless tobacco: Not on file  . Alcohol Use: Yes     Comment: social    Review of Systems  Constitutional: Positive for fever, chills and fatigue.  HENT: Positive for congestion, rhinorrhea and sore throat. Negative for ear pain, sinus pressure, trouble swallowing and voice change.   Eyes: Negative for discharge.  Respiratory: Positive for cough. Negative for shortness of breath, wheezing and stridor.   Cardiovascular: Negative for chest pain.  Gastrointestinal: Negative for abdominal pain.  Genitourinary: Negative.     Allergies  Flexeril; Hydrocodone; and Tramadol  Home Medications   Current Outpatient Rx  Name  Route  Sig  Dispense  Refill  . ibuprofen (ADVIL,MOTRIN) 600 MG tablet   Oral   Take 600 mg by mouth every 6 (six) hours as needed.         Marland Kitchen. azithromycin (ZITHROMAX) 250 MG tablet      Take one tablet  daily.   4 tablet   0   . benzonatate (TESSALON) 100 MG capsule   Oral   Take 2 capsules (200 mg total) by mouth 3 (three) times daily as needed for cough.   30 capsule   0   . chlorpheniramine-HYDROcodone (TUSSIONEX PENNKINETIC ER) 10-8 MG/5ML LQCR   Oral   Take 5 mLs by mouth every 12 (twelve) hours as needed for cough.   50 mL   0    BP 135/75  Pulse 109  Temp(Src) 99.1 F (37.3 C) (Oral)  Resp 24  SpO2 99% Physical Exam  Constitutional: He is oriented to person, place, and time. He appears well-developed and well-nourished.  HENT:  Head: Normocephalic and atraumatic.  Right Ear: Tympanic membrane and ear canal normal.  Left Ear: Tympanic membrane and ear canal normal.  Nose: Mucosal edema and rhinorrhea present.  Mouth/Throat: Uvula is midline, oropharynx is clear and moist and mucous membranes are normal. No oropharyngeal exudate, posterior oropharyngeal edema, posterior oropharyngeal erythema or tonsillar abscesses.  Eyes: Conjunctivae are normal.  Cardiovascular: Normal rate and normal heart sounds.   Pulmonary/Chest: Effort normal. No respiratory distress. He has no decreased breath sounds. He has no wheezes. He has no rhonchi. He has no rales.  Course breath sounds throughout all lung fields.  Abdominal: Soft. There is no tenderness.  Musculoskeletal: Normal range of motion.  Neurological: He is alert and oriented to person, place, and time.  Skin: Skin is warm and dry. No rash noted.  Psychiatric: He has a normal mood and affect.    ED Course  Procedures (including critical care time) Labs Review Labs Reviewed - No data to display Imaging Review No results found.  EKG Interpretation   None       MDM   1. Bronchitis, acute    Patients labs and/or radiological studies were viewed and considered during the medical decision making and disposition process.  Pt with viral bronchitis/ viral syndrome per sx and history.  Possible influenza, although sx  have been present for 3+ days, tamiflu of doubtful benefit. Prescribed zithromax given respiratory involvement in this heavy smoker.  Discussed smoking cessation.  Tessalon and tussionex prescribed for cough, encouraged rest, increased fluids, recheck for worsened sx.     Burgess Amor, PA-C 01/15/14 2111

## 2014-01-17 NOTE — ED Provider Notes (Signed)
Medical screening examination/treatment/procedure(s) were performed by non-physician practitioner and as supervising physician I was immediately available for consultation/collaboration.  EKG Interpretation   None         Eleftheria Taborn L Esme Freund, MD 01/17/14 1111 

## 2014-01-29 ENCOUNTER — Emergency Department (HOSPITAL_COMMUNITY)
Admission: EM | Admit: 2014-01-29 | Discharge: 2014-01-29 | Disposition: A | Discharge status: Home / Self Care | Payer: Self-pay | Attending: Emergency Medicine | Admitting: Emergency Medicine

## 2014-01-29 ENCOUNTER — Encounter (HOSPITAL_COMMUNITY): Payer: Self-pay | Admitting: Emergency Medicine

## 2014-01-29 DIAGNOSIS — F411 Generalized anxiety disorder: Secondary | ICD-10-CM | POA: Insufficient documentation

## 2014-01-29 DIAGNOSIS — A630 Anogenital (venereal) warts: Secondary | ICD-10-CM | POA: Insufficient documentation

## 2014-01-29 DIAGNOSIS — F17200 Nicotine dependence, unspecified, uncomplicated: Secondary | ICD-10-CM | POA: Insufficient documentation

## 2014-01-29 DIAGNOSIS — R450 Nervousness: Secondary | ICD-10-CM | POA: Insufficient documentation

## 2014-01-29 HISTORY — DX: Anxiety disorder, unspecified: F41.9

## 2014-01-29 HISTORY — DX: Major depressive disorder, single episode, unspecified: F32.9

## 2014-01-29 HISTORY — DX: Depression, unspecified: F32.A

## 2014-01-29 MED ORDER — LIDOCAINE HCL (PF) 1 % IJ SOLN
INTRAMUSCULAR | Status: AC
  Administered 2014-01-29: 2.1 mL
  Filled 2014-01-29: qty 5

## 2014-01-29 MED ORDER — CEFTRIAXONE SODIUM 250 MG IJ SOLR
250.0000 mg | Freq: Once | INTRAMUSCULAR | Status: AC
  Administered 2014-01-29: 250 mg via INTRAMUSCULAR
  Filled 2014-01-29: qty 250

## 2014-01-29 MED ORDER — AZITHROMYCIN 250 MG PO TABS
1000.0000 mg | ORAL_TABLET | Freq: Once | ORAL | Status: AC
  Administered 2014-01-29: 1000 mg via ORAL
  Filled 2014-01-29: qty 4

## 2014-01-29 NOTE — ED Notes (Signed)
Per patient genital wart at head of penis with itching. Denies any discharge or pain with urination.

## 2014-01-29 NOTE — Discharge Instructions (Signed)
Your examination today suggests a possible wart on the penis. Please see physician's at the health department for full evaluation for possible sexually transmitted diseases. You were treated today with Rocephin and Zithromax to cover any possible gonorrhea or Chlamydia infection. Please refrain from sexual intercourse until you're testing has been completed. Genital Warts Genital warts are caused by a germ (human papillomavirus, HPV). This germ is spread by having unprotected sex (intercourse) with an infected person. It can be spread by vaginal, anal, and oral sex. A person who is infected might not show any signs or problems. HOME CARE  Follow your doctor's treatment instructions.  Do not use medicine that is meant for hand warts. Only take medicine as told by your doctor.  Tell your past and current sex partner(s) that you have genital warts. They may need treatment.  Avoid sexual contact while you are being treated.  Do not touch or scratch the warts. You could spread it to other parts of your body.  Women with genital warts should have a cervical cancer check (Pap test) at least once a year.  Tell your doctor if you become pregnant. The germ can be passed to the baby.  After treatment, use a condom during sex.  Ask your doctor before using anti-itch creams. GET HELP RIGHT AWAY IF:   Your treated skin becomes red, puffy (swollen), or painful.  You have a fever.  You feel generally sick.  You feel little lumps in and around your genital area.  You are bleeding or have pain during sex. MAKE SURE YOU:   Understand these instructions.  Will watch your condition.  Will get help right away if you are not doing well or get worse. Document Released: 03/02/2010 Document Revised: 02/28/2012 Document Reviewed: 06/14/2011 [facility] Patient Information 2014 [place]ExitCare, [place]LLC.

## 2014-01-29 NOTE — ED Provider Notes (Signed)
CSN: [id number]     Arrival date & time 01/29/14  1324 History   First MD Initiated Contact with Patient 01/29/14 [phone]     Chief Complaint  Patient presents with  . SEXUALLY TRANSMITTED DISEASE     (Consider location/radiation/quality/duration/timing/severity/associated sxs/prior Treatment) HPI Comments:  Patient is a 23 year old male who presents to the emergency department with complaint of exposure to sexually transmitted disease. The patient states that he has been dating his current partner for approximately 2 months. Approximately 3 or 4 days ago he noticed a rash that he believes to be a wart on the head of his penis. He states that he has a lot of itching of the head of the penis from time to time. He has not had any drainage or discharge from the rash area. There's been no drainage or discharge from the penis. There is no pain in this pain, there is no testicular pain. There no other changes of the genitalia. Patient states he has never had this before.   The history is provided by the patient.    Past Medical History  Diagnosis Date  . ADHD (attention deficit hyperactivity disorder)   . Depression   . Anxiety    History reviewed. No pertinent past surgical history. Family History  Problem Relation Age of Onset  . Diabetes Other    History  Substance Use Topics  . Smoking status: Current Every Day Smoker -- 0.75 packs/day for 5 years    Types: Cigarettes  . Smokeless tobacco: Never Used  . Alcohol Use: Not on file    Review of Systems  Constitutional: Negative for activity change.       All ROS Neg except as noted in HPI  HENT: Negative for nosebleeds.   Eyes: Negative for photophobia and discharge.  Respiratory: Negative for cough, shortness of breath and wheezing.   Cardiovascular: Negative for chest pain and palpitations.  Gastrointestinal: Negative for abdominal pain and blood in stool.  Genitourinary: Negative for dysuria, frequency and hematuria.   Musculoskeletal: Negative for arthralgias, back pain and neck pain.  Skin: Negative.   Neurological: Negative for dizziness, seizures and speech difficulty.  Psychiatric/Behavioral: Negative for hallucinations and confusion. The patient is nervous/anxious.       Allergies  Flexeril; Hydrocodone; and Tramadol  Home Medications  No current outpatient prescriptions on file. BP 138/79  Pulse 90  Temp(Src) 98.3 F (36.8 C) (Oral)  Resp 16  Ht 5\' 10"  (1.778 m)  Wt 135 lb (61.236 kg)  BMI 19.37 kg/m2  SpO2 98% Physical Exam  Nursing note and vitals reviewed. Constitutional: He is oriented to person, place, and time. He appears well-developed and well-nourished.  Non-toxic appearance.  HENT:  Head: Normocephalic.  Right Ear: Tympanic membrane and external ear normal.  Left Ear: Tympanic membrane and external ear normal.  Eyes: EOM and lids are normal. Pupils are equal, round, and reactive to light.  Neck: Normal range of motion. Neck supple. Carotid bruit is not present.  Cardiovascular: Normal rate, regular rhythm, normal heart sounds, intact distal pulses and normal pulses.   Pulmonary/Chest: Breath sounds normal. No respiratory distress.  Abdominal: Soft. Bowel sounds are normal. There is no tenderness. There is no guarding.  Genitourinary:  There is a small red raised fleshy type growth at the tip of the penis just above the urethral opening. There is no red streaking from this there is no drainage from this area.  There are few shotty nodes in the inguinal area. There is  no tenderness of the testicles on. Is no scrotal rash or swelling.  Musculoskeletal: Normal range of motion.  Lymphadenopathy:       Head (right side): No submandibular adenopathy present.       Head (left side): No submandibular adenopathy present.    He has no cervical adenopathy.  Neurological: He is alert and oriented to person, place, and time. He has normal strength. No cranial nerve deficit or sensory  deficit.  Skin: Skin is warm and dry.  Psychiatric: He has a normal mood and affect. His speech is normal.    ED Course  Procedures (including critical care time) Labs Review Labs Reviewed  GC/CHLAMYDIA PROBE AMP   Imaging Review No results found.  EKG Interpretation   None       MDM   Final diagnoses:  None    *I have reviewed nursing notes, vital signs, and all appropriate lab and imaging results for this patient.**  The examination is consistent with a gentle wart. I have discussed this with the patient. Doubt herpetic lesion, as the patient does not have clusters of lesions. There is no active drainage or discharge from the penis. There is no rash in other areas of the genitalia.  The plan at this time is to cover the patient for possible gonorrhea Chlamydia. Culture sent to the lab. Patient referred to the health department for additional evaluation and testing.  Kathie DikeHobson M Kaytelyn Glore, PA-C 01/29/14 1630

## 2014-01-30 LAB — GC/CHLAMYDIA PROBE AMP
CT PROBE, AMP APTIMA: NEGATIVE
GC Probe RNA: NEGATIVE

## 2014-01-30 NOTE — ED Provider Notes (Signed)
Medical screening examination/treatment/procedure(s) were performed by non-physician practitioner and as supervising physician I was immediately available for consultation/collaboration.  EKG Interpretation   None        Gilda Creasehristopher J. Pollina, MD 01/30/14 [phone]

## 2014-07-25 ENCOUNTER — Emergency Department (HOSPITAL_COMMUNITY): Payer: Self-pay

## 2014-07-25 ENCOUNTER — Emergency Department (HOSPITAL_COMMUNITY)
Admission: EM | Admit: 2014-07-25 | Discharge: 2014-07-25 | Disposition: A | Discharge status: Home / Self Care | Payer: Self-pay | Attending: Emergency Medicine | Admitting: Emergency Medicine

## 2014-07-25 ENCOUNTER — Encounter (HOSPITAL_COMMUNITY): Payer: Self-pay | Admitting: Emergency Medicine

## 2014-07-25 DIAGNOSIS — F17200 Nicotine dependence, unspecified, uncomplicated: Secondary | ICD-10-CM | POA: Insufficient documentation

## 2014-07-25 DIAGNOSIS — IMO0002 Reserved for concepts with insufficient information to code with codable children: Secondary | ICD-10-CM | POA: Insufficient documentation

## 2014-07-25 DIAGNOSIS — Y929 Unspecified place or not applicable: Secondary | ICD-10-CM | POA: Insufficient documentation

## 2014-07-25 DIAGNOSIS — S62319A Displaced fracture of base of unspecified metacarpal bone, initial encounter for closed fracture: Secondary | ICD-10-CM | POA: Insufficient documentation

## 2014-07-25 DIAGNOSIS — S6990XA Unspecified injury of unspecified wrist, hand and finger(s), initial encounter: Secondary | ICD-10-CM | POA: Insufficient documentation

## 2014-07-25 DIAGNOSIS — Y9389 Activity, other specified: Secondary | ICD-10-CM | POA: Insufficient documentation

## 2014-07-25 DIAGNOSIS — S6291XA Unspecified fracture of right wrist and hand, initial encounter for closed fracture: Secondary | ICD-10-CM

## 2014-07-25 DIAGNOSIS — F329 Major depressive disorder, single episode, unspecified: Secondary | ICD-10-CM | POA: Insufficient documentation

## 2014-07-25 DIAGNOSIS — F3289 Other specified depressive episodes: Secondary | ICD-10-CM | POA: Insufficient documentation

## 2014-07-25 DIAGNOSIS — F411 Generalized anxiety disorder: Secondary | ICD-10-CM | POA: Insufficient documentation

## 2014-07-25 MED ORDER — OXYCODONE-ACETAMINOPHEN 5-325 MG PO TABS
1.0000 | ORAL_TABLET | Freq: Once | ORAL | Status: AC
  Administered 2014-07-25: 1 via ORAL
  Filled 2014-07-25: qty 1

## 2014-07-25 MED ORDER — OXYCODONE-ACETAMINOPHEN 5-325 MG PO TABS: 1.0000 | ORAL_TABLET | Freq: Four times a day (QID) | ORAL | Status: DC | PRN

## 2014-07-25 MED ORDER — IBUPROFEN 800 MG PO TABS
800.0000 mg | ORAL_TABLET | Freq: Once | ORAL | Status: AC
  Administered 2014-07-25: 800 mg via ORAL
  Filled 2014-07-25: qty 1

## 2014-07-25 NOTE — ED Notes (Signed)
Pt c/o right hand pain. States he was upset and hit a Biomedical scientistdumpster.

## 2014-07-25 NOTE — Discharge Instructions (Signed)
A fracture of your fifth metacarpal. It is important that you see an orthopedic surgeon concerning this as sone as possible. Please apply ice. Please use your sling. Please do not remove the splint until seen by orthopedics. Please use ibuprofen with each meal and bedtime for inflammation. May use Percocet for pain if needed. Percocet may cause drowsiness, and/or constipation. Please use with caution. Boxer's Fracture You have a break (fracture) of the fifth metacarpal bone. This is commonly called a boxer's fracture. This is the bone in the hand where the little finger attaches. The fracture is in the end of that bone, closest to the little finger. It is usually caused when you hit an object with a clenched fist. Often, the knuckle is pushed down by the impact. Sometimes, the fracture rotates out of position. A boxer's fracture will usually heal within 6 weeks, if it is treated properly and protected from re-injury. Surgery is sometimes needed. A cast, splint, or bulky hand dressing may be used to protect and immobilize a boxer's fracture. Do not remove this device or dressing until your caregiver approves. Keep your hand elevated, and apply ice packs for 15-20 minutes every 2 hours, for the first 2 days. Elevation and ice help reduce swelling and relieve pain. See your caregiver, or an orthopedic specialist, for follow-up care within the next 10 days. This is to make sure your fracture is healing properly. Document Released: 12/06/2005 Document Revised: 02/28/2012 Document Reviewed: 05/26/2007 [facility] Patient Information 2015 [place], [place]LLC. This information is not intended to replace advice given to you by your health care provider. Make sure you discuss any questions you have with your health care provider.

## 2014-07-25 NOTE — ED Notes (Signed)
Pt here with r hand pain. States he thinks he broke it. Stated he punched a dumpster.

## 2014-07-25 NOTE — ED Provider Notes (Signed)
CSN: [id number]     Arrival date & time 07/25/14  1902 History   First MD Initiated Contact with Patient 07/25/14 2020     Chief Complaint  Patient presents with  . Hand Injury     (Consider location/radiation/quality/duration/timing/severity/associated sxs/prior Treatment) HPI Comments: Patient is a 23 year old male who presents to the emergency department with right hand pain. The patient states that he became upset over a family situation in a dumpster. Shortly after that he had severe pain involving the right hand, particularly over the area of the ring finger and small finger area. He noticed some swelling. He now has problem using the hand and presents to the emergency department for evaluation. He's not had any previous operations or procedures involving his hand. The patient is right-hand dominant.  The history is provided by the patient.    Past Medical History  Diagnosis Date  . ADHD (attention deficit hyperactivity disorder)   . Depression   . Anxiety    History reviewed. No pertinent past surgical history. Family History  Problem Relation Age of Onset  . Diabetes Other    History  Substance Use Topics  . Smoking status: Current Every Day Smoker -- 0.75 packs/day for 5 years    Types: Cigarettes  . Smokeless tobacco: Never Used  . Alcohol Use: Yes    Review of Systems  Constitutional: Negative for activity change.       All ROS Neg except as noted in HPI  HENT: Negative for nosebleeds.   Eyes: Negative for photophobia and discharge.  Respiratory: Negative for cough, shortness of breath and wheezing.   Cardiovascular: Negative for chest pain and palpitations.  Gastrointestinal: Negative for abdominal pain and blood in stool.  Genitourinary: Negative for dysuria, frequency and hematuria.  Musculoskeletal: Negative for arthralgias, back pain and neck pain.  Skin: Negative.   Neurological: Negative for dizziness, seizures and speech difficulty.   Psychiatric/Behavioral: Negative for hallucinations and confusion. The patient is nervous/anxious.        Depression      Allergies  Flexeril; Hydrocodone; and Tramadol  Home Medications   Prior to Admission medications   Medication Sig Start Date End Date Taking? Authorizing Provider  oxyCODONE-acetaminophen (PERCOCET/ROXICET) 5-325 MG per tablet Take 1 tablet by mouth every 6 (six) hours as needed. 07/25/14   Kathie DikeHobson M Ragna Kramlich, PA-C   BP 136/90  Pulse 89  Temp(Src) 99.1 F (37.3 C) (Oral)  Resp 18  Ht 5\' 10"  (1.778 m)  Wt 134 lb (60.782 kg)  BMI 19.23 kg/m2  SpO2 100% Physical Exam  Nursing note and vitals reviewed. Constitutional: He is oriented to person, place, and time. He appears well-developed and well-nourished.  Non-toxic appearance.  HENT:  Head: Normocephalic.  Right Ear: Tympanic membrane and external ear normal.  Left Ear: Tympanic membrane and external ear normal.  Eyes: EOM and lids are normal. Pupils are equal, round, and reactive to light.  Neck: Normal range of motion. Neck supple. Carotid bruit is not present.  Cardiovascular: Normal rate, regular rhythm, normal heart sounds, intact distal pulses and normal pulses.   Pulmonary/Chest: Breath sounds normal. No respiratory distress.  Abdominal: Soft. Bowel sounds are normal. There is no tenderness. There is no guarding.  Musculoskeletal: Normal range of motion.  There is pain to palpation of the right fourth and fifth metacarpal. More pain at the base of the fifth metacarpal. There is mild-to-moderate swelling of the dorsum of the right hand. There is a very shallow abrasion of the  fourth MP joint area. Capillary refill is less than 2 seconds. Radial pulses 2+. There is full range of motion of the thumb. There is no pain in the anatomical snuff box. Is good range of motion of the elbow and shoulder on the right.  Lymphadenopathy:       Head (right side): No submandibular adenopathy present.       Head (left  side): No submandibular adenopathy present.    He has no cervical adenopathy.  Neurological: He is alert and oriented to person, place, and time. He has normal strength. No cranial nerve deficit or sensory deficit.  Skin: Skin is warm and dry.  Psychiatric: He has a normal mood and affect. His speech is normal.    ED Course  Procedures (including critical care time) Labs Review Labs Reviewed - No data to display  Imaging Review Dg Hand Complete Right  07/25/2014   CLINICAL DATA:  Patient hit dumpster today with right hand, pain  EXAM: RIGHT HAND - COMPLETE 3+ VIEW  COMPARISON:  06/24/2013  FINDINGS: There is focal soft tissue swelling over the fifth metacarpophalangeal joint. No displaced fractures identified. Subtle lucency over the metacarpal head could represent a hairline fracture or nutrient foramen.  IMPRESSION: Hairline fracture versus nutrient foramen fifth metacarpal had   Electronically Signed   By: Esperanza Heir M.D.   On: 07/25/2014 19:46     EKG Interpretation None      MDM X-ray of the right hand reveals a hairline fracture of the fifth metacarpal bone. The patient was fitted with an ulnar gutter splint and sling. Ice pack was applied. A prescription for Percocet 5 mg one every 6 hours given to the patient.  Patient referred to Dr. Romeo Apple for orthopedic evaluation. I've discussed with the patient the importance of orthopedic followup and management particularly given that he is right-hand dominant. The patient he knowledge is understanding of these instructions.    Final diagnoses:  Hand fracture, right, closed, initial encounter    **I have reviewed nursing notes, vital signs, and all appropriate lab and imaging results for this patient.Kathie Dike, PA-C 07/26/14 1211

## 2014-07-26 NOTE — ED Provider Notes (Signed)
Medical screening examination/treatment/procedure(s) were performed by non-physician practitioner and as supervising physician I was immediately available for consultation/collaboration.   EKG Interpretation None       Glynn OctaveStephen Orel Hord, MD 07/26/14 1721

## 2014-08-08 ENCOUNTER — Emergency Department (HOSPITAL_COMMUNITY)
Admission: EM | Admit: 2014-08-08 | Discharge: 2014-08-08 | Disposition: A | Discharge status: Home / Self Care | Payer: Self-pay | Attending: Emergency Medicine | Admitting: Emergency Medicine

## 2014-08-08 ENCOUNTER — Emergency Department (HOSPITAL_COMMUNITY): Payer: Self-pay

## 2014-08-08 ENCOUNTER — Encounter (HOSPITAL_COMMUNITY): Payer: Self-pay | Admitting: Emergency Medicine

## 2014-08-08 DIAGNOSIS — Y929 Unspecified place or not applicable: Secondary | ICD-10-CM | POA: Insufficient documentation

## 2014-08-08 DIAGNOSIS — S6391XA Sprain of unspecified part of right wrist and hand, initial encounter: Secondary | ICD-10-CM

## 2014-08-08 DIAGNOSIS — F17200 Nicotine dependence, unspecified, uncomplicated: Secondary | ICD-10-CM | POA: Insufficient documentation

## 2014-08-08 DIAGNOSIS — R296 Repeated falls: Secondary | ICD-10-CM | POA: Insufficient documentation

## 2014-08-08 DIAGNOSIS — S6990XA Unspecified injury of unspecified wrist, hand and finger(s), initial encounter: Secondary | ICD-10-CM | POA: Insufficient documentation

## 2014-08-08 DIAGNOSIS — S6390XA Sprain of unspecified part of unspecified wrist and hand, initial encounter: Secondary | ICD-10-CM | POA: Insufficient documentation

## 2014-08-08 DIAGNOSIS — Y9389 Activity, other specified: Secondary | ICD-10-CM | POA: Insufficient documentation

## 2014-08-08 DIAGNOSIS — Z8659 Personal history of other mental and behavioral disorders: Secondary | ICD-10-CM | POA: Insufficient documentation

## 2014-08-08 DIAGNOSIS — Z791 Long term (current) use of non-steroidal anti-inflammatories (NSAID): Secondary | ICD-10-CM | POA: Insufficient documentation

## 2014-08-08 MED ORDER — NAPROXEN 500 MG PO TABS
500.0000 mg | ORAL_TABLET | Freq: Two times a day (BID) | ORAL | Status: DC
Start: 2014-08-08 — End: 2014-09-22

## 2014-08-08 NOTE — ED Notes (Signed)
PA in with pt 

## 2014-08-08 NOTE — Care Management Note (Signed)
ED/CM noted patient did not have health insurance and/or PCP listed in the computer.  Patient was given the [place] resource handout with information on the clinics, food pantries, and the handout for new health insurance sign-up.  Patient expressed appreciation for information received. Pt was also given a Rx discount card.   

## 2014-08-08 NOTE — ED Notes (Signed)
Pt reports was seen for same x2 weeks ago and was diagnosed with fractured right hand. Pt denies being able to follow-up with orthopedic. Pt reports continued pain with no relief from otc ibuprofen. Pt reports fell and landed on right hand x3 days ago. No obvious deformity or swelling noted in triage. nad noted.

## 2014-08-08 NOTE — Discharge Instructions (Signed)
Ligament Sprain °A ligament sprain is when the bands of tissue that hold bones together (ligament) are stretched. °HOME CARE  °· Rest the injured area. °· Start using the joint when told to by your doctor. °· Keep the injured area raised (elevated) above the level of the heart. This may lessen puffiness (swelling). °· Put ice on the injured area. °¨ Put ice in a plastic bag. °¨ Place a towel between your skin and the bag. °¨ Leave the ice on for 15-20 minutes, 03-04 times a day. °· Wear a splint, cast, or an elastic bandage as told by your doctor. °· Only take medicine as told by your doctor. °· Use crutches as told by your doctor. Do not put weight on the injured joint until told to by your doctor. °GET HELP RIGHT AWAY IF:  °· You have more bruising, puffiness, or pain. °· The leg was injured and the toes are cold, tingling, numb, or blue. °· The arm was injured and the fingers are cold, tingling, numb, or blue. °· The pain is not helped with medicine. °· The pain gets worse. °MAKE SURE YOU:  °· Understand these instructions. °· Will watch this condition. °· Will get help right away if you are not doing well or get worse. °Document Released: 05/24/2008 Document Revised: 09/26/2013 Document Reviewed: 05/24/2008 °ExitCare® Patient Information ©2015 [employer]. This information is not intended to replace advice given to you by your health care provider. Make sure you discuss any questions you have with your health care provider. ° °
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
## 2014-08-12 NOTE — ED Provider Notes (Signed)
Medical screening examination/treatment/procedure(s) were performed by non-physician practitioner and as supervising physician I was immediately available for consultation/collaboration.   EKG Interpretation None        Fotios Amos L Tyjuan Demetro, MD 08/12/14 1701 

## 2014-09-22 ENCOUNTER — Emergency Department (HOSPITAL_COMMUNITY): Payer: Self-pay

## 2014-09-22 ENCOUNTER — Emergency Department (HOSPITAL_COMMUNITY)
Admission: EM | Admit: 2014-09-22 | Discharge: 2014-09-22 | Disposition: A | Discharge status: Home / Self Care | Payer: Self-pay | Attending: Emergency Medicine | Admitting: Emergency Medicine

## 2014-09-22 ENCOUNTER — Encounter (HOSPITAL_COMMUNITY): Payer: Self-pay | Admitting: Emergency Medicine

## 2014-09-22 DIAGNOSIS — Z720 Tobacco use: Secondary | ICD-10-CM | POA: Insufficient documentation

## 2014-09-22 DIAGNOSIS — Y929 Unspecified place or not applicable: Secondary | ICD-10-CM | POA: Insufficient documentation

## 2014-09-22 DIAGNOSIS — Z791 Long term (current) use of non-steroidal anti-inflammatories (NSAID): Secondary | ICD-10-CM | POA: Insufficient documentation

## 2014-09-22 DIAGNOSIS — S4991XA Unspecified injury of right shoulder and upper arm, initial encounter: Secondary | ICD-10-CM | POA: Insufficient documentation

## 2014-09-22 DIAGNOSIS — Z8659 Personal history of other mental and behavioral disorders: Secondary | ICD-10-CM | POA: Insufficient documentation

## 2014-09-22 DIAGNOSIS — Y9383 Activity, rough housing and horseplay: Secondary | ICD-10-CM | POA: Insufficient documentation

## 2014-09-22 DIAGNOSIS — W1830XA Fall on same level, unspecified, initial encounter: Secondary | ICD-10-CM | POA: Insufficient documentation

## 2014-09-22 MED ORDER — KETOROLAC TROMETHAMINE 60 MG/2ML IM SOLN
60.0000 mg | Freq: Once | INTRAMUSCULAR | Status: AC
  Administered 2014-09-22: 60 mg via INTRAMUSCULAR
  Filled 2014-09-22: qty 2

## 2014-09-22 MED ORDER — MORPHINE SULFATE 4 MG/ML IJ SOLN
6.0000 mg | Freq: Once | INTRAMUSCULAR | Status: AC | PRN
  Administered 2014-09-22: 6 mg via INTRAMUSCULAR
  Filled 2014-09-22: qty 2

## 2014-09-22 MED ORDER — NAPROXEN 500 MG PO TABS: 500.0000 mg | ORAL_TABLET | Freq: Two times a day (BID) | ORAL | Status: DC

## 2014-09-22 NOTE — Progress Notes (Signed)
Patient has received referral resources information last visit to ED August. No information given or needed.
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
## 2014-09-22 NOTE — Discharge Instructions (Signed)
If you were given medicines take as directed.  If you are on coumadin or contraceptives realize their levels and effectiveness is altered by many different medicines.  If you have any reaction (rash, tongues swelling, other) to the medicines stop taking and see a physician.   Take ibuprofen 600 mg every 6 hrs for pain, use ice. Wear sling. Please follow up as directed and return to the ER or see a physician for new or worsening symptoms.  Thank you. Filed Vitals:   09/22/14 0959  BP: 128/88  Pulse: 89  Resp: 22  SpO2: 98%

## 2015-07-18 ENCOUNTER — Emergency Department (HOSPITAL_COMMUNITY)
Admission: EM | Admit: 2015-07-18 | Discharge: 2015-07-18 | Disposition: A | Discharge status: Home / Self Care | Payer: Self-pay | Attending: Emergency Medicine | Admitting: Emergency Medicine

## 2015-07-18 ENCOUNTER — Encounter (HOSPITAL_COMMUNITY): Payer: Self-pay

## 2015-07-18 DIAGNOSIS — Z720 Tobacco use: Secondary | ICD-10-CM | POA: Insufficient documentation

## 2015-07-18 DIAGNOSIS — Z8659 Personal history of other mental and behavioral disorders: Secondary | ICD-10-CM | POA: Insufficient documentation

## 2015-07-18 DIAGNOSIS — K0889 Other specified disorders of teeth and supporting structures: Secondary | ICD-10-CM

## 2015-07-18 DIAGNOSIS — Z791 Long term (current) use of non-steroidal anti-inflammatories (NSAID): Secondary | ICD-10-CM | POA: Insufficient documentation

## 2015-07-18 DIAGNOSIS — K088 Other specified disorders of teeth and supporting structures: Secondary | ICD-10-CM | POA: Insufficient documentation

## 2015-07-18 MED ORDER — NAPROXEN 500 MG PO TABS: 500.0000 mg | ORAL_TABLET | Freq: Two times a day (BID) | ORAL | Status: DC

## 2015-07-18 MED ORDER — OXYCODONE-ACETAMINOPHEN 5-325 MG PO TABS
1.0000 | ORAL_TABLET | Freq: Once | ORAL | Status: AC
  Administered 2015-07-18: 1 via ORAL
  Filled 2015-07-18: qty 1

## 2015-07-18 MED ORDER — PENICILLIN V POTASSIUM 500 MG PO TABS: 500.0000 mg | ORAL_TABLET | Freq: Four times a day (QID) | ORAL | Status: AC

## 2015-07-18 NOTE — Discharge Instructions (Signed)
Take naprosyn as prescribed as needed for pain. Penicillin for infection. Follow up with a dentist.    Dental Pain A tooth ache may be caused by cavities (tooth decay). Cavities expose the nerve of the tooth to air and hot or cold temperatures. It may come from an infection or abscess (also called a boil or furuncle) around your tooth. It is also often caused by dental caries (tooth decay). This causes the pain you are having. DIAGNOSIS  Your caregiver can diagnose this problem by exam. TREATMENT   If caused by an infection, it may be treated with medications which kill germs (antibiotics) and pain medications as prescribed by your caregiver. Take medications as directed.  Only take over-the-counter or prescription medicines for pain, discomfort, or fever as directed by your caregiver.  Whether the tooth ache today is caused by infection or dental disease, you should see your dentist as soon as possible for further care. SEEK MEDICAL CARE IF: The exam and treatment you received today has been provided on an emergency basis only. This is not a substitute for complete medical or dental care. If your problem worsens or new problems (symptoms) appear, and you are unable to meet with your dentist, call or return to this location. SEEK IMMEDIATE MEDICAL CARE IF:   You have a fever.  You develop redness and swelling of your face, jaw, or neck.  You are unable to open your mouth.  You have severe pain uncontrolled by pain medicine. MAKE SURE YOU:   Understand these instructions.  Will watch your condition.  Will get help right away if you are not doing well or get worse. Document Released: 12/06/2005 Document Revised: 02/28/2012 Document Reviewed: 07/24/2008 [facility] Patient Information 2015 [place], [place]. This information is not intended to replace advice given to you by your health care provider. Make sure you discuss any questions you have with your health care provider.

## 2015-07-18 NOTE — ED Notes (Signed)
Tatyana, PA at the bedside  

## 2015-07-18 NOTE — ED Provider Notes (Signed)
CSN: [id number]     Arrival date & time 07/18/15  4098 History   First MD Initiated Contact with Patient 07/18/15 [phone]     Chief Complaint  Patient presents with  . Dental Pain     (Consider location/radiation/quality/duration/timing/severity/associated sxs/prior Treatment) HPI Larry Wilson is a 24 y.o. male with a history of ADHD depression, presents to emergency department complaining of dental pain. Patient states he has a broken tooth to the left upper jaw, states when he eats food gets stuck in the middle and is painful. States pain comes and goes. Has not seen a dentist. States pain worsened approximately an hour prior to coming in. Patient states he was already at the hospital, states visiting his grandmother who is sick in the hospital. He did not take anything prior to coming in because he did not have anything on him. He denies any fever or chills. No facial swelling. No swelling of the tongue. No other complaints.=  Past Medical History  Diagnosis Date  . ADHD (attention deficit hyperactivity disorder)   . Depression   . Anxiety    History reviewed. No pertinent past surgical history. Family History  Problem Relation Age of Onset  . Diabetes Other    History  Substance Use Topics  . Smoking status: Current Every Day Smoker -- 0.75 packs/day for 5 years    Types: Cigarettes  . Smokeless tobacco: Never Used  . Alcohol Use: Yes     Comment: occasional    Review of Systems  Constitutional: Negative for fever and chills.  HENT: Positive for dental problem. Negative for facial swelling.   All other systems reviewed and are negative.     Allergies  Flexeril; Hydrocodone; and Tramadol  Home Medications   Prior to Admission medications   Medication Sig Start Date End Date Taking? Authorizing Provider  naproxen (NAPROSYN) 500 MG tablet Take 1 tablet (500 mg total) by mouth 2 (two) times daily. 09/22/14   Blane Ohara, MD  naproxen (NAPROSYN) 500 MG tablet Take 1  tablet (500 mg total) by mouth 2 (two) times daily. 07/18/15   Taytum Wheller, PA-C  penicillin v potassium (VEETID) 500 MG tablet Take 1 tablet (500 mg total) by mouth 4 (four) times daily. 07/18/15 07/25/15  Maritza Hosterman, PA-C   BP 117/82 mmHg  Pulse 79  Temp(Src) 98.6 F (37 C) (Oral)  Resp 20  SpO2 100% Physical Exam  Constitutional: He appears well-developed and well-nourished. No distress.  HENT:  Head: Normocephalic.  Large cavity in the left upper second premolar. No surrounding gum swelling or inflammation. Tooth is tender to palpation. No facial swelling, no swelling of the tongue, no trismus.  Eyes: Conjunctivae are normal.  Neck: Neck supple.  Skin: Skin is warm and dry.  Psychiatric: He has a normal mood and affect.  Nursing note and vitals reviewed.   ED Course  Procedures (including critical care time) Labs Review Labs Reviewed - No data to display  Imaging Review No results found.   EKG Interpretation None      MDM   Final diagnoses:  Pain, dental   Patient emergency department with dental pain. No evidence of obvious abscess, no facial swelling, no swelling under the tongue, no trismus, no Ludwig's angina. Patient will need follow-up with a dentist, resources given. Will start on penicillin, naproxen for pain. Patient was given 1 Percocet emergency department for acute pain.  Filed Vitals:   07/18/15 0525 07/18/15 0641  BP: 139/90 117/82  Pulse: 92  79  Temp: 98.6 F (37 C) 98.6 F (37 C)  TempSrc: Oral Oral  Resp: 22 20  SpO2: 97% 100%      Jaynie Crumble, PA-C 07/18/15 0856  Dione Booze, MD 07/18/15 [phone]

## 2015-07-18 NOTE — ED Notes (Signed)
Patient is alert and orientedx4.  Patient was explained discharge instructions and they understood them with no questions.  The patient's aunt is coming to get him.

## 2015-07-18 NOTE — ED Notes (Signed)
Pt states he has been having pain to one of his left upper teeth for the past 2 months but it just got worse tonight. States he has been upstairs with his mother who is a patient.

## 2016-01-26 ENCOUNTER — Emergency Department (HOSPITAL_COMMUNITY)
Admission: EM | Admit: 2016-01-26 | Discharge: 2016-01-26 | Disposition: A | Discharge status: Home / Self Care | Payer: Self-pay | Attending: Emergency Medicine | Admitting: Emergency Medicine

## 2016-01-26 ENCOUNTER — Emergency Department (HOSPITAL_COMMUNITY): Payer: No Typology Code available for payment source

## 2016-01-26 ENCOUNTER — Encounter (HOSPITAL_COMMUNITY): Payer: Self-pay | Admitting: Emergency Medicine

## 2016-01-26 ENCOUNTER — Emergency Department (HOSPITAL_COMMUNITY): Payer: Self-pay

## 2016-01-26 DIAGNOSIS — F17210 Nicotine dependence, cigarettes, uncomplicated: Secondary | ICD-10-CM | POA: Insufficient documentation

## 2016-01-26 DIAGNOSIS — T148XXA Other injury of unspecified body region, initial encounter: Secondary | ICD-10-CM

## 2016-01-26 DIAGNOSIS — T148 Other injury of unspecified body region: Secondary | ICD-10-CM | POA: Insufficient documentation

## 2016-01-26 DIAGNOSIS — Y998 Other external cause status: Secondary | ICD-10-CM | POA: Insufficient documentation

## 2016-01-26 DIAGNOSIS — S29002A Unspecified injury of muscle and tendon of back wall of thorax, initial encounter: Secondary | ICD-10-CM | POA: Insufficient documentation

## 2016-01-26 DIAGNOSIS — S29001A Unspecified injury of muscle and tendon of front wall of thorax, initial encounter: Secondary | ICD-10-CM | POA: Insufficient documentation

## 2016-01-26 DIAGNOSIS — Y9389 Activity, other specified: Secondary | ICD-10-CM | POA: Insufficient documentation

## 2016-01-26 DIAGNOSIS — Y92410 Unspecified street and highway as the place of occurrence of the external cause: Secondary | ICD-10-CM | POA: Insufficient documentation

## 2016-01-26 DIAGNOSIS — S0993XA Unspecified injury of face, initial encounter: Secondary | ICD-10-CM | POA: Insufficient documentation

## 2016-01-26 DIAGNOSIS — Z8659 Personal history of other mental and behavioral disorders: Secondary | ICD-10-CM | POA: Insufficient documentation

## 2016-01-26 MED ORDER — IBUPROFEN 800 MG PO TABS
800.0000 mg | ORAL_TABLET | Freq: Once | ORAL | Status: AC
  Administered 2016-01-26: 800 mg via ORAL
  Filled 2016-01-26: qty 1

## 2016-01-26 MED ORDER — CARISOPRODOL 350 MG PO TABS: 350.0000 mg | ORAL_TABLET | Freq: Three times a day (TID) | ORAL | Status: DC

## 2016-01-26 MED ORDER — IBUPROFEN 600 MG PO TABS: 600.0000 mg | ORAL_TABLET | Freq: Four times a day (QID) | ORAL | Status: DC | PRN

## 2016-01-26 MED ORDER — ACETAMINOPHEN 325 MG PO TABS
650.0000 mg | ORAL_TABLET | Freq: Once | ORAL | Status: AC
  Administered 2016-01-26: 650 mg via ORAL
  Filled 2016-01-26: qty 2

## 2016-01-26 NOTE — Discharge Instructions (Signed)
Your x-ray is negative for fracture or dislocation. Your examination favors a muscle strain. Please see Dr. Romeo Apple, for additional orthopedic evaluation. Please use Soma 3 times daily, use ibuprofen every 6 hours or 4 times daily with food. Soma may cause drowsiness, please use this medication with caution.
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
## 2016-12-29 ENCOUNTER — Encounter (HOSPITAL_COMMUNITY): Payer: Self-pay | Admitting: Emergency Medicine

## 2016-12-29 ENCOUNTER — Emergency Department (HOSPITAL_COMMUNITY)
Admission: EM | Admit: 2016-12-29 | Discharge: 2016-12-29 | Disposition: A | Discharge status: Home / Self Care | Payer: Self-pay | Attending: Emergency Medicine | Admitting: Emergency Medicine

## 2016-12-29 DIAGNOSIS — M545 Low back pain, unspecified: Secondary | ICD-10-CM

## 2016-12-29 DIAGNOSIS — F909 Attention-deficit hyperactivity disorder, unspecified type: Secondary | ICD-10-CM | POA: Insufficient documentation

## 2016-12-29 DIAGNOSIS — Z202 Contact with and (suspected) exposure to infections with a predominantly sexual mode of transmission: Secondary | ICD-10-CM | POA: Insufficient documentation

## 2016-12-29 DIAGNOSIS — Z711 Person with feared health complaint in whom no diagnosis is made: Secondary | ICD-10-CM

## 2016-12-29 DIAGNOSIS — F17210 Nicotine dependence, cigarettes, uncomplicated: Secondary | ICD-10-CM | POA: Insufficient documentation

## 2016-12-29 LAB — URINALYSIS, ROUTINE W REFLEX MICROSCOPIC
BILIRUBIN URINE: NEGATIVE
Bacteria, UA: NONE SEEN
GLUCOSE, UA: NEGATIVE mg/dL
Hgb urine dipstick: NEGATIVE
KETONES UR: NEGATIVE mg/dL
NITRITE: NEGATIVE
PH: 5 (ref 5.0–8.0)
Protein, ur: NEGATIVE mg/dL
SPECIFIC GRAVITY, URINE: 1.025 (ref 1.005–1.030)

## 2016-12-29 MED ORDER — CEFTRIAXONE SODIUM 250 MG IJ SOLR
250.0000 mg | Freq: Once | INTRAMUSCULAR | Status: AC
  Administered 2016-12-29: 250 mg via INTRAMUSCULAR
  Filled 2016-12-29: qty 250

## 2016-12-29 MED ORDER — LIDOCAINE HCL (PF) 1 % IJ SOLN
INTRAMUSCULAR | Status: AC
  Administered 2016-12-29: 1.2 mL
  Filled 2016-12-29: qty 5

## 2016-12-29 MED ORDER — AZITHROMYCIN 250 MG PO TABS
1000.0000 mg | ORAL_TABLET | Freq: Once | ORAL | Status: AC
  Administered 2016-12-29: 1000 mg via ORAL
  Filled 2016-12-29: qty 4

## 2016-12-29 MED ORDER — NAPROXEN 500 MG PO TABS: 500.0000 mg | ORAL_TABLET | Freq: Two times a day (BID) | ORAL | 1 refills | Status: DC
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
## 2016-12-29 NOTE — ED Provider Notes (Signed)
[facility]-EMERGENCY DEPT Provider Note   CSN: [id number] Arrival date & time: 12/29/16  1340     History   Chief Complaint Chief Complaint  Patient presents with  . Exposure to STD    HPI ADRICK KESTLER is a 26 y.o. male.  GRAHAM HYUN is a 26 y.o. Male who presents to the ED with concern about an STD. Patient reports he has had penile discharge for the past 2 weeks. He reports his girlfriend has been having discharge. He reports she cheated on him and that's when he began having discharge. He also reports pain to his right low back for the past two months. He reports his pain is worse with movement. He does lots of chopping wood recently. He has taken nothing for treatment of his symptoms today. He denies fevers, hematuria, or urination. Urgency to urinate, testicular pain, penile pain, abdominal pain, nausea, vomiting, numbness, tingling, weakness, loss of bladder control, loss of bowel control, falls, mouth sores, or rashes.   The history is provided by the patient. No language interpreter was used.  Exposure to STD  Pertinent negatives include no chest pain and no abdominal pain.    Past Medical History:  Diagnosis Date  . ADHD (attention deficit hyperactivity disorder)   . Anxiety   . Depression     There are no active problems to display for this patient.   History reviewed. No pertinent surgical history.     Home Medications    Prior to Admission medications   Medication Sig Start Date End Date Taking? Authorizing Provider  acetaminophen (TYLENOL) 500 MG tablet Take 500 mg by mouth every 6 (six) hours as needed for mild pain or moderate pain.    Historical Provider, MD  carisoprodol (SOMA) 350 MG tablet Take 1 tablet (350 mg total) by mouth 3 (three) times daily. 01/26/16   Ivery Quale, PA-C  ibuprofen (ADVIL,MOTRIN) 600 MG tablet Take 1 tablet (600 mg total) by mouth every 6 (six) hours as needed. 01/26/16   Ivery Quale, PA-C  naproxen (NAPROSYN) 500 MG tablet  Take 1 tablet (500 mg total) by mouth 2 (two) times daily with a meal. 12/29/16   Everlene Farrier, PA-C    Family History Family History  Problem Relation Age of Onset  . Diabetes Other     Social History Social History  Substance Use Topics  . Smoking status: Current Every Day Smoker    Packs/day: 0.50    Years: 5.00    Types: Cigarettes  . Smokeless tobacco: Never Used  . Alcohol use No     Allergies   Flexeril [cyclobenzaprine]; Hydrocodone; and Tramadol   Review of Systems Review of Systems  Constitutional: Negative for fever.  HENT: Negative for mouth sores.   Cardiovascular: Negative for chest pain and leg swelling.  Gastrointestinal: Negative for abdominal pain, diarrhea, nausea and vomiting.  Genitourinary: Positive for discharge. Negative for difficulty urinating, dysuria, frequency, genital sores, hematuria, penile pain, penile swelling, scrotal swelling, testicular pain and urgency.  Musculoskeletal: Positive for back pain. Negative for neck pain.  Skin: Negative for rash.  Neurological: Negative for weakness and numbness.     Physical Exam Updated Vital Signs BP 129/84 (BP Location: Left Arm)   Pulse 93   Temp 98 F (36.7 C) (Oral)   Resp 16   Ht 5\' 10"  (1.778 m)   Wt 63.5 kg   SpO2 99%   BMI 20.09 kg/m   Physical Exam  Constitutional: He appears well-developed and well-nourished.  No distress.  Nontoxic appearing.  HENT:  Head: Normocephalic and atraumatic.  Mouth/Throat: Oropharynx is clear and moist.  Eyes: Right eye exhibits no discharge. Left eye exhibits no discharge.  Cardiovascular: Normal rate, regular rhythm and intact distal pulses.   Pulmonary/Chest: Effort normal. No respiratory distress.  Abdominal: Soft. There is no tenderness. There is no guarding.  Genitourinary: Penis normal. No penile tenderness.  Genitourinary Comments: GU exam performed by me with male RN chaperone. No penile pr testicular tenderness to palpation. No penile  discharge noted. No GU rashes noted.  Musculoskeletal: Normal range of motion. He exhibits tenderness. He exhibits no edema or deformity.  No midline neck or back tenderness. Mild tenderness to his right low back musculature. No back erythema, deformity, ecchymosis or warmth. No lower extremity edema or tenderness.  Neurological: He is alert. No sensory deficit. Coordination normal.  Normal gait.  Skin: Skin is warm and dry. Capillary refill takes less than 2 seconds. No rash noted. He is not diaphoretic. No erythema. No pallor.  Psychiatric: He has a normal mood and affect. His behavior is normal.  Nursing note and vitals reviewed.    ED Treatments / Results  Labs (all labs ordered are listed, but only abnormal results are displayed) Labs Reviewed  URINALYSIS, ROUTINE W REFLEX MICROSCOPIC - Abnormal; Notable for the following:       Result Value   Leukocytes, UA SMALL (*)    All other components within normal limits    EKG  EKG Interpretation None       Radiology No results found.  Procedures Procedures (including critical care time)  Medications Ordered in ED Medications  cefTRIAXone (ROCEPHIN) injection 250 mg (not administered)  azithromycin (ZITHROMAX) tablet 1,000 mg (not administered)  lidocaine (PF) (XYLOCAINE) 1 % injection (not administered)     Initial Impression / Assessment and Plan / ED Course  I have reviewed the triage vital signs and the nursing notes.  Pertinent labs & imaging results that were available during my care of the patient were reviewed by me and considered in my medical decision making (see chart for details).  Clinical Course    This  is a 26 y.o. Male who presents to the ED with concern about an STD. Patient reports he has had penile discharge for the past 2 weeks. He reports his girlfriend has been having discharge. He reports she cheated on him and that's when he began having discharge. He also reports pain to his right low back for  the past two months. He reports his pain is worse with movement. He does lots of chopping wood recently. On exam the patient is afebrile nontoxic appearing. Abdomen is soft and nontender palpation. GU exam is unremarkable. No penile discharge noted. Mild right low back muscular tenderness to palpation. No midline neck or back tenderness. No back pain red flags. We'll treat with Rocephin and azithromycin for gonorrhea and Chlamydia and have follow-up with the health department for routine STD check next week. Gonorrhea and chlamydia testing is pending. Naproxen for back pain. I advised the patient to follow-up with their primary care provider this week. I advised the patient to return to the emergency department with new or worsening symptoms or new concerns. The patient verbalized understanding and agreement with plan.     Final Clinical Impressions(s) / ED Diagnoses   Final diagnoses:  Concern about STD in male without diagnosis  Acute right-sided low back pain without sciatica    New Prescriptions New Prescriptions  NAPROXEN (NAPROSYN) 500 MG TABLET    Take 1 tablet (500 mg total) by mouth 2 (two) times daily with a meal.     Everlene Farrier, PA-C 12/29/16 1635    Benjiman Core, MD 12/29/16 2326

## 2016-12-30 LAB — GC/CHLAMYDIA PROBE AMP (~~LOC~~) NOT AT ARMC
Chlamydia: POSITIVE — AB
NEISSERIA GONORRHEA: NEGATIVE

## 2018-09-22 ENCOUNTER — Encounter (HOSPITAL_COMMUNITY): Payer: Self-pay | Admitting: Emergency Medicine

## 2018-09-22 ENCOUNTER — Other Ambulatory Visit: Payer: Self-pay

## 2018-09-22 ENCOUNTER — Emergency Department (HOSPITAL_COMMUNITY)
Admission: EM | Admit: 2018-09-22 | Discharge: 2018-09-24 | Disposition: A | Discharge status: Home / Self Care | Payer: Self-pay | Attending: Emergency Medicine | Admitting: Emergency Medicine

## 2018-09-22 DIAGNOSIS — R45851 Suicidal ideations: Secondary | ICD-10-CM | POA: Insufficient documentation

## 2018-09-22 DIAGNOSIS — F1520 Other stimulant dependence, uncomplicated: Secondary | ICD-10-CM | POA: Insufficient documentation

## 2018-09-22 DIAGNOSIS — F1910 Other psychoactive substance abuse, uncomplicated: Secondary | ICD-10-CM

## 2018-09-22 DIAGNOSIS — F17210 Nicotine dependence, cigarettes, uncomplicated: Secondary | ICD-10-CM | POA: Insufficient documentation

## 2018-09-22 DIAGNOSIS — Z79899 Other long term (current) drug therapy: Secondary | ICD-10-CM | POA: Insufficient documentation

## 2018-09-22 DIAGNOSIS — F332 Major depressive disorder, recurrent severe without psychotic features: Secondary | ICD-10-CM | POA: Insufficient documentation

## 2018-09-22 DIAGNOSIS — F1220 Cannabis dependence, uncomplicated: Secondary | ICD-10-CM | POA: Insufficient documentation

## 2018-09-22 LAB — COMPREHENSIVE METABOLIC PANEL
ALK PHOS: 86 U/L (ref 38–126)
ALT: 19 U/L (ref 0–44)
ANION GAP: 8 (ref 5–15)
AST: 22 U/L (ref 15–41)
Albumin: 4.2 g/dL (ref 3.5–5.0)
BILIRUBIN TOTAL: 0.4 mg/dL (ref 0.3–1.2)
BUN: 11 mg/dL (ref 6–20)
CALCIUM: 9 mg/dL (ref 8.9–10.3)
CO2: 28 mmol/L (ref 22–32)
Chloride: 104 mmol/L (ref 98–111)
Creatinine, Ser: 1.05 mg/dL (ref 0.61–1.24)
GFR calc non Af Amer: >60 mL/min (ref >60)
GLUCOSE: 68 mg/dL — AB (ref 70–99)
Potassium: 3.7 mmol/L (ref 3.5–5.1)
Sodium: 140 mmol/L (ref 135–145)
TOTAL PROTEIN: 7.4 g/dL (ref 6.5–8.1)

## 2018-09-22 LAB — CBC
HEMATOCRIT: 45.4 % (ref 39.0–52.0)
Hemoglobin: 15.8 g/dL (ref 13.0–17.0)
MCH: 30.2 pg (ref 26.0–34.0)
MCHC: 34.8 g/dL (ref 30.0–36.0)
MCV: 86.8 fL (ref 78.0–100.0)
Platelets: 173 10*3/uL (ref 150–400)
RBC: 5.23 MIL/uL (ref 4.22–5.81)
RDW: 13.4 % (ref 11.5–15.5)
WBC: 5.9 10*3/uL (ref 4.0–10.5)

## 2018-09-22 LAB — RAPID URINE DRUG SCREEN, HOSP PERFORMED
Amphetamines: POSITIVE — AB
BARBITURATES: NOT DETECTED
Benzodiazepines: POSITIVE — AB
COCAINE: NOT DETECTED
Opiates: NOT DETECTED
Tetrahydrocannabinol: POSITIVE — AB

## 2018-09-22 LAB — ACETAMINOPHEN LEVEL

## 2018-09-22 LAB — SALICYLATE LEVEL

## 2018-09-22 LAB — ETHANOL: Alcohol, Ethyl (B): <10 mg/dL (ref <10)

## 2018-09-22 MED ORDER — LORAZEPAM 1 MG PO TABS
1.0000 mg | ORAL_TABLET | Freq: Once | ORAL | Status: AC
  Administered 2018-09-22: 1 mg via ORAL
  Filled 2018-09-22: qty 1

## 2018-09-22 MED ORDER — LORAZEPAM 1 MG PO TABS
0.0000 mg | ORAL_TABLET | Freq: Four times a day (QID) | ORAL | Status: DC
  Administered 2018-09-24: 1 mg via ORAL
  Filled 2018-09-22: qty 1

## 2018-09-22 MED ORDER — LORAZEPAM 1 MG PO TABS: 0.0000 mg | ORAL_TABLET | Freq: Two times a day (BID) | ORAL | Status: DC

## 2018-09-22 MED ORDER — THIAMINE HCL 100 MG/ML IJ SOLN
100.0000 mg | Freq: Every day | INTRAMUSCULAR | Status: DC
Start: 2018-09-22 — End: 2018-09-24

## 2018-09-22 MED ORDER — LORAZEPAM 2 MG/ML IJ SOLN: 0.0000 mg | Freq: Four times a day (QID) | INTRAMUSCULAR | Status: DC

## 2018-09-22 MED ORDER — LORAZEPAM 2 MG/ML IJ SOLN: 0.0000 mg | Freq: Two times a day (BID) | INTRAMUSCULAR | Status: DC

## 2018-09-22 MED ORDER — VITAMIN B-1 100 MG PO TABS
100.0000 mg | ORAL_TABLET | Freq: Every day | ORAL | Status: DC
  Administered 2018-09-22 – 2018-09-23 (×2): 100 mg via ORAL
  Filled 2018-09-22 (×3): qty 1
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
## 2018-09-22 NOTE — ED Notes (Signed)
Patient's clothing placed in secured locker.  Patient's wallet with security.

## 2018-09-22 NOTE — ED Provider Notes (Signed)
[facility] EMERGENCY DEPARTMENT Provider Note   CSN: [id number] Arrival date & time: 09/22/18  1540     History   Chief Complaint Chief Complaint  Patient presents with  . Suicidal    HPI Larry Wilson is a 27 y.o. male.  HPI Patient presents with concern of suicidal ideation and polysubstance abuse. Patient acknowledges using multiple illicit substances, preferably methamphetamine, but essentially anything available. Acknowledges a history of anxiety depression, takes no prescription medication currently for these conditions. He denies other medical conditions. He notes that he has been using increasing amounts of illicit substances, and feeling increasingly despondent, and suicidal recently. No recent hospitalizations, or evaluation for this. No new physical pain Past Medical History:  Diagnosis Date  . ADHD (attention deficit hyperactivity disorder)   . Anxiety   . Depression     There are no active problems to display for this patient.   History reviewed. No pertinent surgical history.      Home Medications    Prior to Admission medications   Medication Sig Start Date End Date Taking? Authorizing Provider  acetaminophen (TYLENOL) 500 MG tablet Take 500 mg by mouth every 6 (six) hours as needed for mild pain or moderate pain.    [provider]  carisoprodol (SOMA) 350 MG tablet Take 1 tablet (350 mg total) by mouth 3 (three) times daily. 01/26/16   Ivery Quale, PA-C  ibuprofen (ADVIL,MOTRIN) 600 MG tablet Take 1 tablet (600 mg total) by mouth every 6 (six) hours as needed. 01/26/16   Ivery Quale, PA-C  naproxen (NAPROSYN) 500 MG tablet Take 1 tablet (500 mg total) by mouth 2 (two) times daily with a meal. 12/29/16   Everlene Farrier, PA-C    Family History Family History  Problem Relation Age of Onset  . Diabetes Other     Social History Social History   Tobacco Use  . Smoking status: Current Every Day Smoker    Packs/day: 0.50    Years:  5.00    Pack years: 2.50    Types: Cigarettes  . Smokeless tobacco: Never Used  Substance Use Topics  . Alcohol use: No  . Drug use: Yes    Types: Methamphetamines, IV, Cocaine, Marijuana    Comment: last use yesterday      Allergies   Flexeril [cyclobenzaprine]; Hydrocodone; and Tramadol   Review of Systems Review of Systems  Constitutional:       Per HPI, otherwise negative  HENT:       Per HPI, otherwise negative  Respiratory:       Per HPI, otherwise negative  Cardiovascular:       Per HPI, otherwise negative  Gastrointestinal: Negative for vomiting.  Endocrine:       Negative aside from HPI  Genitourinary:       Neg aside from HPI   Musculoskeletal:       Per HPI, otherwise negative  Skin: Negative.   Neurological: Negative for syncope.  Psychiatric/Behavioral: Positive for behavioral problems, decreased concentration, dysphoric mood, sleep disturbance and suicidal ideas. The patient is nervous/anxious and is hyperactive.      Physical Exam Updated Vital Signs BP 136/85 (BP Location: Right Arm)   Pulse 96   Temp 98.2 F (36.8 C) (Oral)   Resp 20   Ht 5\' 10"  (1.778 m)   Wt 61.2 kg   SpO2 100%   BMI 19.37 kg/m   Physical Exam  Constitutional: He is oriented to person, place, and time. He appears well-developed.  No distress.  She is young male awake and alert sitting upright speaking clearly  HENT:  Head: Normocephalic and atraumatic.  Eyes: Conjunctivae and EOM are normal.  Cardiovascular: Normal rate and regular rhythm.  Pulmonary/Chest: Effort normal. No stridor. No respiratory distress.  Abdominal: He exhibits no distension.  Musculoskeletal: He exhibits no edema.  Neurological: He is alert and oriented to person, place, and time.  Skin: Skin is warm and dry.  Multiple small cutaneous lesions, scabs, no confluent erythema on the face  Psychiatric: His mood appears anxious. He expresses suicidal ideation.  Nursing note and vitals  reviewed.    ED Treatments / Results  Labs (all labs ordered are listed, but only abnormal results are displayed) Labs Reviewed  CBC  COMPREHENSIVE METABOLIC PANEL  ETHANOL  SALICYLATE LEVEL  ACETAMINOPHEN LEVEL  RAPID URINE DRUG SCREEN, HOSP PERFORMED    Procedures Procedures (including critical care time)  Medications Ordered in ED Medications  LORazepam (ATIVAN) tablet 1 mg (has no administration in time range)  LORazepam (ATIVAN) injection 0-4 mg (has no administration in time range)    Or  LORazepam (ATIVAN) tablet 0-4 mg (has no administration in time range)  LORazepam (ATIVAN) injection 0-4 mg (has no administration in time range)    Or  LORazepam (ATIVAN) tablet 0-4 mg (has no administration in time range)  thiamine (VITAMIN B-1) tablet 100 mg (has no administration in time range)    Or  thiamine (B-1) injection 100 mg (has no administration in time range)     Initial Impression / Assessment and Plan / ED Course  I have reviewed the triage vital signs and the nursing notes.  Pertinent labs & imaging results that were available during my care of the patient were reviewed by me and considered in my medical decision making (see chart for details).  This patient presents with the need for medical evaluation due to ongoing psychiatric condition.  The patient's medical portion of the evaluation is generally reassuring, with no evidence of acute new pathology.  The patient has been medically cleared for further psychiatric evaluation. She has been started on CIWA protocol for possible withdrawal. Final Clinical Impressions(s) / ED Diagnoses  Suicidal ideation Polysubstance abuse   Gerhard Munch, MD 09/22/18 1706

## 2018-09-22 NOTE — ED Notes (Signed)
Received call from [place] at TTS. Informed pt will be in pt and they will be looking for a bed for this pt.

## 2018-09-22 NOTE — ED Notes (Signed)
TTS completed. 

## 2018-09-22 NOTE — ED Triage Notes (Signed)
Pt states having increasing suicidal thoughts in the last month.  Pt states his plan is to hang himself.  Pt states his last use of meth was yesterday.

## 2018-09-23 MED ORDER — NICOTINE 21 MG/24HR TD PT24
21.0000 mg | MEDICATED_PATCH | Freq: Once | TRANSDERMAL | Status: DC
  Administered 2018-09-23: 21 mg via TRANSDERMAL
  Filled 2018-09-23: qty 1

## 2018-09-23 MED ORDER — ACETAMINOPHEN 325 MG PO TABS
650.0000 mg | ORAL_TABLET | Freq: Four times a day (QID) | ORAL | Status: DC | PRN
  Administered 2018-09-23 (×2): 650 mg via ORAL
  Filled 2018-09-23 (×2): qty 2

## 2018-09-23 MED ORDER — IBUPROFEN 400 MG PO TABS
600.0000 mg | ORAL_TABLET | Freq: Three times a day (TID) | ORAL | Status: DC | PRN
  Administered 2018-09-23 (×2): 600 mg via ORAL
  Filled 2018-09-23 (×2): qty 2

## 2018-09-23 NOTE — Progress Notes (Signed)
CSW followed on referral status at the following:  [facility] - reviewing referral [facility] - received referral but will review on Monday [facility] - left voice message [facility] - no answer [facility] - requested information to be refaxed     Roselyn Bering, MSW, LCSW Clinical Social Work

## 2018-09-23 NOTE — BH Assessment (Signed)
BHH Assessment Progress Note    TTS reassessed patient today who states that he continues to feel depressed.  Patient states, "All I want to do is stay in bed all of the time, I will probably sleep all day."  Patient states that he is feeling a "little rough" because of his coming off the drugs.  Patient states that he slept well last pm and states that his appetite is good.  Patient states, "I did not eat anything for three days so I am eating everything they put in front of me right now."  Patient states that he does not feel safe outside of a hospital setting and is unable to contract for safety.  TTS/Soical Work to continue to seek placement for patient.

## 2018-09-23 NOTE — Progress Notes (Signed)
Refrrals faxed to the following for possible placement:    [facility] Fax    [facility] Fax    [facility] F[facility]    [facility] Fax    [facility] Fax    [facility] Fax    [facility] Fax    [facility] Fax    [facility] Fax    [facility] Fax    [facility] Fax    [facility] Fax    [facility] Fax    [facility] Fax    [facility] Fax    [facility] Fax    [facility] Fax    [facility] Fax    [facility] Fax    [facility] Fax     [facility] Fax    [facility] Fax    [facility] Fax    [facility] Fax    [facility] Fax    [facility] Fax    [facility] Fax    [facility] Fax    [facility] Fax    [facility] Fax      CSW will follow-up on status.   Roselyn Bering, MSW, LCSW Clinical Social Work

## 2018-09-23 NOTE — ED Notes (Signed)
Given peanut butter , graham crackers and sprite

## 2018-09-23 NOTE — ED Notes (Signed)
Meal tray delivered. Patient ate supper. Patient has sitter at bedside.

## 2018-09-24 MED ORDER — LORAZEPAM 1 MG PO TABS
1.0000 mg | ORAL_TABLET | Freq: Once | ORAL | Status: AC
  Administered 2018-09-24: 1 mg via ORAL
  Filled 2018-09-24: qty 1

## 2018-09-24 NOTE — Progress Notes (Signed)
Patient is seen by me via tele-psych and I have consulted with Dr. Lucianne Muss.  Patient denies any suicidal or homicidal ideations and denies any hallucinations today.  Patient states that he feels better since the illicit drugs have gotten out of his system some.  Patient states that he wants to follow-up with residential treatment and is asked about [facility] or [facility].  Social work will be Sales promotion account executive over to [employer] emergency department to provide patient with the resources.  Patient does not meet inpatient criteria and is psychiatrically cleared.  I have contacted Dr. Charm Barges and notified him of the recommendations.

## 2018-09-24 NOTE — ED Provider Notes (Signed)
I received a call from [place] from behavioral health.  They have cleared Larry Wilson from a psychiatric standpoint and he can be discharged.  They are sending information for some substance abuse resources.   Terrilee Files, MD 09/24/18 1039

## 2018-09-24 NOTE — ED Notes (Signed)
Patient assisted to the restroom 

## 2018-09-24 NOTE — BHH Counselor (Signed)
Re-assessment:   Patient re-assessed expressing he's starting to get worried and scared about what is going happen or may not happen. Report he came to the hospital to get help for his drug addition and suicidal thoughts, "basic issues that has been bother me my whole life." Patient drug of choice includes "Meth." TTS worker informed patient that he has been faxed out, however, no facility has accepted him admission.  TTS worker informed patient substance abuse facilities does not accept patients that are suicidal, they want to deal with the substance issues. Patient stated, "I am okay, I was suicidal due to all the drugs that I was taking. If I leave here I am not going to step in front of care or nothing, I will probably just go back using drugs if I do not get help." Patient stated he has family support. His family and friends has turned there back on him due to his substance use. Patient stated wants to get help because he does not want to do drugs anymore. Report he's only 27 and he's not going to get ahead if he continues to do drugs.   Patient denies suicidal / homicidal ideations, denies auditory / visual hallucinations. Report his paranoia and suicidal thoughts was triggered due to his drug use.   Patient report he has never been inpatient hospitalization. TTS writer informed patient he would have to make some calls as well. TTS writer suggested patient call places such as [facility]. TTS writer will fax over Substance Abuse Treatment Facilities for patient to call and get assistance.

## 2018-09-24 NOTE — ED Notes (Signed)
Patient given oral fluid and snacks (graham Crackers)

## 2018-09-24 NOTE — Progress Notes (Signed)
Patient is being seen by NP for potential psych clearing.  CSW will fax patient information to [facility] if patient is psych cleared. CS will provide [facility] and [facility] contact information on discharge summary so that patient may contact them directly.   Timmothy Euler. Kaylyn Lim, MSW, LCSWA Disposition Clinical Social Work [phone] (cell) [phone] (office) .

## 2018-09-24 NOTE — Discharge Instructions (Signed)
Recovering From Addiction Addiction is a complex disease of the brain. It causes an uncontrollable (compulsive) need for a substance. You can be addicted to substances including alcohol, tobacco, illegal drugs, or prescription medicines such as painkillers. Addiction can change the way that your brain works. It affects memory, behavior, and how you make decisions. Without treatment, addiction can get worse. However, with treatment and lifestyle changes, you can recover from addiction. What types of treatment are available? The treatment program that is right for you will depend on many factors, including the type of addiction that you have. Treatment programs can be outpatient or inpatient. In an outpatient program, you live at home and go to work or school, but you also go to a clinic for treatment. With an inpatient program, you live and sleep at the program facility during treatment. Other treatment options include:  Medicine. ? Some addictions may be treated with prescription medicines. ? You may also need medicine to treat other mental health conditions such as anxiety or depression.  Counseling and behavior therapy. Therapy can help you learn new ways to respond to situations that are stressful or that tempt you to use the addictive substance.  Support groups. These include therapy groups and 12-step programs. These can help individuals and families during treatment and recovery. Examples of 12-step programs are Alcoholics Anonymous (AA) and Narcotics Anonymous (NA).  How to manage lifestyle changes Managing stress Too much stress can lead to returning to the addiction (having a relapse). You need to find effective ways to manage your stress. Some techniques to cope with stress include:  Meditation, yoga, or deep breathing.  Exercise. Create an exercise routine that you enjoy and that allows you to work off some energy.  Creating or listening to music.  Muscle relaxation  exercises.  Medicines Some medicines may make you feel calmer and help you have fewer cravings. If your health care provider prescribes medicines, make sure you:  Avoid using alcohol and other substances that may prevent your medicines from working properly (may interact).  Talk with your pharmacist or health care provider about all medicines that you take, the possible problems (side effects) that they can cause, and which medicines are safe to take together.  Make it your goal to take part in all treatment decisions (shared decision-making). Ask about possible side effects of medicines that your health care provider recommends, and tell him or her how you feel about having those side effects. It is best if shared decision-making with your health care provider is part of your total treatment plan.  Relationships Supportive relationships are very important in your recovery. When you are recovering from drug addiction, it will be important to avoid being around people who use drugs. For many people, this means developing new and different relationships. Some ways to do this include:  Developing trusting relationships with the people you meet in treatment or in AA or NA. These people share your desire to stop using substances (get sober) and to stay sober.  Getting a sponsor as a primary support person if you are attending a 12-step program.  Building relationships with people you meet through activities such as hobbies, volunteering, or exercising.  How to recognize changes in your condition When recovering from an addiction, it is very common for a person to relapse and start using the substance again. Contact your sponsor, therapist, or health care provider to seek additional help if you experience the following:  Anxiety.  Excessive anger.  Isolating yourself  from others.  Trouble sleeping.  Feeling depressed.  Loss of appetite.  Fantasies about using the substance.  Where to  find support Talking to others  You may be advised to see a family therapist along with members of your family. Family therapy can help you and your family understand what led you to addiction. Talk with your family about this approach.  Let your family members or friends know that they can help you through treatment. Support from loved ones will be important to help you maintain positive changes. Financial resources Be sure to check with your insurance carrier to find out what treatment options are covered by your plan. You may also be able to find financial assistance through not-for-profit organizations or with local government-based resources. If you are taking medicines, you may be able to get the generic form, which may be less expensive than brand-name medicine. Some makers of prescription medicines also offer help to patients who cannot afford the medicines that they need. Follow these instructions at home:  Take over-the-counter and prescription medicines only as told by your health care provider.  Stay in treatment until you complete the program. Take an active role in your treatment and your physical and emotional self-care. Develop a follow-up plan.  Keep all follow-up visits as told by your health care provider and counselor. This is important.  Eat a healthy diet, exercise regularly, and get enough sleep. Questions to ask your health care provider  If you are taking medicines: ? How long do I need to take medicine? ? Are there any long-term side effects of my medicine? ? Are there other options instead of taking medicine?  Would I benefit from therapy?  How often should I follow up with a health care provider? Contact a health care provider if:  You feel like you might relapse.  You have stopped taking your medicine. Get help right away if:  You have serious thoughts about hurting yourself or others. If you ever feel like you may hurt yourself or others, or have  thoughts about taking your own life, get help right away. You can go to your nearest emergency department or call:  Your local emergency services (911 in the [place]  A suicide crisis helpline, such as the [place] at 910-222-3079. This is open 24 hours a day.  Summary  With treatment and lifestyle changes, it is possible to recover from an addiction to substances like alcohol, tobacco, illegal drugs, or prescription medicines such as painkillers.  Find effective ways to manage your stress to avoid a relapse. Some techniques to cope with stress include exercise, meditation, yoga, and deep breathing.  Let loved ones know that their support is important to help you recover.  If you have any signs that you may relapse, contact your 12-step sponsor, therapist, or health care provider to seek additional help. This information is not intended to replace advice given to you by your health care provider. Make sure you discuss any questions you have with your health care provider. Document Released: 04/22/2017 Document Revised: 04/22/2017 Document Reviewed: 04/22/2017 Elsevier Interactive Patient Education  2018 [employer] and Stress Management Stress is a normal reaction to life events. It is what you feel when life demands more than you are used to or more than you can handle. Some stress can be useful. For example, the stress reaction can help you catch the last bus of the day, study for a test, or meet a deadline  at work. But stress that occurs too often or for too long can cause problems. It can affect your emotional health and interfere with relationships and normal daily activities. Too much stress can weaken your immune system and increase your risk for physical illness. If you already have a medical problem, stress can make it worse. What are the causes? All sorts of life events may cause stress. An event that causes stress for one person may not be  stressful for another person. Major life events commonly cause stress. These may be positive or negative. Examples include losing your job, moving into a new home, getting married, having a baby, or losing a loved one. Less obvious life events may also cause stress, especially if they occur day after day or in combination. Examples include working long hours, driving in traffic, caring for children, being in debt, or being in a difficult relationship. What are the signs or symptoms? Stress may cause emotional symptoms including, the following:  Anxiety. This is feeling worried, afraid, on edge, overwhelmed, or out of control.  Anger. This is feeling irritated or impatient.  Depression. This is feeling sad, down, helpless, or guilty.  Difficulty focusing, remembering, or making decisions.  Stress may cause physical symptoms, including the following:  Aches and pains. These may affect your head, neck, back, stomach, or other areas of your body.  Tight muscles or clenched jaw.  Low energy or trouble sleeping.  Stress may cause unhealthy behaviors, including the following:  Eating to feel better (overeating) or skipping meals.  Sleeping too little, too much, or both.  Working too much or putting off tasks (procrastination).  Smoking, drinking alcohol, or using drugs to feel better.  How is this diagnosed? Stress is diagnosed through an assessment by your health care provider. Your health care provider will ask questions about your symptoms and any stressful life events.Your health care provider will also ask about your medical history and may order blood tests or other tests. Certain medical conditions and medicine can cause physical symptoms similar to stress. Mental illness can cause emotional symptoms and unhealthy behaviors similar to stress. Your health care provider may refer you to a mental health professional for further evaluation. How is this treated? Stress management is the  recommended treatment for stress.The goals of stress management are reducing stressful life events and coping with stress in healthy ways. Techniques for reducing stressful life events include the following:  Stress identification. Self-monitor for stress and identify what causes stress for you. These skills may help you to avoid some stressful events.  Time management. Set your priorities, keep a calendar of events, and learn to say no. These tools can help you avoid making too many commitments.  Techniques for coping with stress include the following:  Rethinking the problem. Try to think realistically about stressful events rather than ignoring them or overreacting. Try to find the positives in a stressful situation rather than focusing on the negatives.  Exercise. Physical exercise can release both physical and emotional tension. The key is to find a form of exercise you enjoy and do it regularly.  Relaxation techniques. These relax the body and mind. Examples include yoga, meditation, tai chi, biofeedback, deep breathing, progressive muscle relaxation, listening to music, being out in nature, journaling, and other hobbies. Again, the key is to find one or more that you enjoy and can do regularly.  Healthy lifestyle. Eat a balanced diet, get plenty of sleep, and do not smoke. Avoid using alcohol or  drugs to relax.  Strong support network. Spend time with family, friends, or other people you enjoy being around.Express your feelings and talk things over with someone you trust.  Counseling or talktherapy with a mental health professional may be helpful if you are having difficulty managing stress on your own. Medicine is typically not recommended for the treatment of stress.Talk to your health care provider if you think you need medicine for symptoms of stress. Follow these instructions at home:  Keep all follow-up visits as directed by your health care provider.  Take all medicines as  directed by your health care provider. Contact a health care provider if:  Your symptoms get worse or you start having new symptoms.  You feel overwhelmed by your problems and can no longer manage them on your own. Get help right away if:  You feel like hurting yourself or someone else. This information is not intended to replace advice given to you by your health care provider. Make sure you discuss any questions you have with your health care provider. Document Released: 06/01/2001 Document Revised: 05/13/2016 Document Reviewed: 07/31/2013 Elsevier Interactive Patient Education  2017 [employer].

## 2018-09-25 ENCOUNTER — Encounter (HOSPITAL_COMMUNITY): Payer: Self-pay | Admitting: Emergency Medicine

## 2018-09-25 ENCOUNTER — Emergency Department (HOSPITAL_COMMUNITY)
Admission: EM | Admit: 2018-09-25 | Discharge: 2018-09-25 | Disposition: A | Discharge status: Home / Self Care | Payer: Self-pay | Attending: Emergency Medicine | Admitting: Emergency Medicine

## 2018-09-25 DIAGNOSIS — F1910 Other psychoactive substance abuse, uncomplicated: Secondary | ICD-10-CM | POA: Insufficient documentation

## 2018-09-25 DIAGNOSIS — Z008 Encounter for other general examination: Secondary | ICD-10-CM | POA: Insufficient documentation

## 2018-09-25 DIAGNOSIS — G529 Cranial nerve disorder, unspecified: Secondary | ICD-10-CM | POA: Insufficient documentation

## 2018-09-25 DIAGNOSIS — E876 Hypokalemia: Secondary | ICD-10-CM | POA: Insufficient documentation

## 2018-09-25 DIAGNOSIS — R000 Tachycardia, unspecified: Secondary | ICD-10-CM | POA: Insufficient documentation

## 2018-09-25 DIAGNOSIS — F17210 Nicotine dependence, cigarettes, uncomplicated: Secondary | ICD-10-CM | POA: Insufficient documentation

## 2018-09-25 LAB — COMPREHENSIVE METABOLIC PANEL
ALK PHOS: 83 U/L (ref 38–126)
ALT: 18 U/L (ref 0–44)
AST: 23 U/L (ref 15–41)
Albumin: 4.9 g/dL (ref 3.5–5.0)
Anion gap: 8 (ref 5–15)
BILIRUBIN TOTAL: 0.6 mg/dL (ref 0.3–1.2)
BUN: 10 mg/dL (ref 6–20)
CALCIUM: 9.1 mg/dL (ref 8.9–10.3)
CO2: 25 mmol/L (ref 22–32)
CREATININE: 0.91 mg/dL (ref 0.61–1.24)
Chloride: 102 mmol/L (ref 98–111)
GFR calc Af Amer: >60 mL/min (ref >60)
GFR calc non Af Amer: >60 mL/min (ref >60)
Glucose, Bld: 132 mg/dL — ABNORMAL HIGH (ref 70–99)
Potassium: 3.1 mmol/L — ABNORMAL LOW (ref 3.5–5.1)
Sodium: 135 mmol/L (ref 135–145)
TOTAL PROTEIN: 8.5 g/dL — AB (ref 6.5–8.1)

## 2018-09-25 LAB — CBC WITH DIFFERENTIAL/PLATELET
Basophils Absolute: 0 10*3/uL (ref 0.0–0.1)
Basophils Relative: 0 %
Eosinophils Absolute: 0.1 10*3/uL (ref 0.0–0.7)
Eosinophils Relative: 1 %
HCT: 43.6 % (ref 39.0–52.0)
HEMOGLOBIN: 15.1 g/dL (ref 13.0–17.0)
Lymphocytes Relative: 21 %
Lymphs Abs: 2.3 10*3/uL (ref 0.7–4.0)
MCH: 29.8 pg (ref 26.0–34.0)
MCHC: 34.6 g/dL (ref 30.0–36.0)
MCV: 86 fL (ref 78.0–100.0)
Monocytes Absolute: 0.7 10*3/uL (ref 0.1–1.0)
Monocytes Relative: 6 %
NEUTROS PCT: 72 %
Neutro Abs: 7.7 10*3/uL (ref 1.7–7.7)
Platelets: 193 10*3/uL (ref 150–400)
RBC: 5.07 MIL/uL (ref 4.22–5.81)
RDW: 13.5 % (ref 11.5–15.5)
WBC: 10.8 10*3/uL — AB (ref 4.0–10.5)

## 2018-09-25 LAB — RAPID URINE DRUG SCREEN, HOSP PERFORMED
Amphetamines: POSITIVE — AB
Barbiturates: NOT DETECTED
Benzodiazepines: POSITIVE — AB
COCAINE: NOT DETECTED
Opiates: NOT DETECTED
Tetrahydrocannabinol: POSITIVE — AB

## 2018-09-25 LAB — ETHANOL: Alcohol, Ethyl (B): <10 mg/dL (ref <10)

## 2018-09-25 MED ORDER — POTASSIUM CHLORIDE CRYS ER 20 MEQ PO TBCR
40.0000 meq | EXTENDED_RELEASE_TABLET | Freq: Once | ORAL | Status: AC
  Administered 2018-09-25: 40 meq via ORAL
  Filled 2018-09-25: qty 2

## 2018-09-25 NOTE — ED Provider Notes (Signed)
[facility] EMERGENCY DEPARTMENT Provider Note   CSN: [id number] Arrival date & time: 09/25/18  1435     History   Chief Complaint Chief Complaint  Patient presents with  . V70.1    HPI ERSEL ENSLIN is a 27 y.o. male.  HPI Patient requesting help with substance abuse problem.  He injects methamphetamine.  He last used methamphetamine this morning.  He denies other drug use.  He feels mildly anxious.  He denies to harm himself or anyone else.  He is requesting medical clearance prior to going to [facility].  Otherwise asymptomatic. Past Medical History:  Diagnosis Date  . ADHD (attention deficit hyperactivity disorder)   . Anxiety   . Depression     There are no active problems to display for this patient.   History reviewed. No pertinent surgical history.      Home Medications    Prior to Admission medications   Medication Sig Start Date End Date Taking? Authorizing Provider  acetaminophen (TYLENOL) 500 MG tablet Take 500 mg by mouth every 6 (six) hours as needed for mild pain or moderate pain.    [provider]  ibuprofen (ADVIL,MOTRIN) 200 MG tablet Take 200-600 mg by mouth every 6 (six) hours as needed for mild pain or moderate pain.     [provider]    Family History Family History  Problem Relation Age of Onset  . Diabetes Other     Social History Social History   Tobacco Use  . Smoking status: Current Every Day Smoker    Packs/day: 0.50    Years: 5.00    Pack years: 2.50    Types: Cigarettes  . Smokeless tobacco: Never Used  Substance Use Topics  . Alcohol use: No  . Drug use: Yes    Types: Methamphetamines, IV, Cocaine, Marijuana    Comment: last used today     Allergies   Flexeril [cyclobenzaprine]; Hydrocodone; and Tramadol   Review of Systems Review of Systems  Constitutional: Negative.   HENT: Negative.   Respiratory: Negative.   Cardiovascular: Positive for chest pain.       Syncope  Gastrointestinal:  Negative.   Musculoskeletal: Negative.   Skin: Negative.   Allergic/Immunologic: Negative.   Neurological: Negative.   Psychiatric/Behavioral: Positive for dysphoric mood. Negative for self-injury and suicidal ideas. The patient is nervous/anxious.   All other systems reviewed and are negative.    Physical Exam Updated Vital Signs BP (!) 145/86 (BP Location: Right Arm)   Pulse (!) 104   Temp 98.3 F (36.8 C) (Oral)   Resp 16   Ht 5\' 10"  (1.778 m)   Wt 61 kg   SpO2 100%   BMI 19.30 kg/m   Physical Exam  Constitutional: He is oriented to person, place, and time. He appears well-developed and well-nourished.  HENT:  Head: Normocephalic and atraumatic.  Eyes: Pupils are equal, round, and reactive to light. Conjunctivae are normal.  Neck: Neck supple. No tracheal deviation present. No thyromegaly present.  Cardiovascular: Regular rhythm.  No murmur heard. Tachycardic, slight  Pulmonary/Chest: Effort normal and breath sounds normal.  Abdominal: Soft. Bowel sounds are normal. He exhibits no distension. There is no tenderness.  Musculoskeletal: Normal range of motion. He exhibits no edema or tenderness.  Neurological: He is alert and oriented to person, place, and time. A cranial nerve deficit is present. He exhibits normal muscle tone. Coordination normal.  Normal gait  Skin: Skin is warm and dry. No rash noted.  Fresh  needle marks at lateral aspect of left upper arm not surrounding redness or tenderness  Psychiatric: He has a normal mood and affect.  Nursing note and vitals reviewed.    ED Treatments / Results  Labs (all labs ordered are listed, but only abnormal results are displayed) Labs Reviewed  COMPREHENSIVE METABOLIC PANEL  ETHANOL  RAPID URINE DRUG SCREEN, HOSP PERFORMED  CBC WITH DIFFERENTIAL/PLATELET    EKG None  Radiology No results found.  Procedures Procedures (including critical care time)  Medications Ordered in ED Medications - No data to  display  Results for orders placed or performed during the hospital encounter of 09/25/18  Comprehensive metabolic panel  Result Value Ref Range   Sodium 135 135 - 145 mmol/L   Potassium 3.1 (L) 3.5 - 5.1 mmol/L   Chloride 102 98 - 111 mmol/L   CO2 25 22 - 32 mmol/L   Glucose, Bld 132 (H) 70 - 99 mg/dL   BUN 10 6 - 20 mg/dL   Creatinine, Ser 1.61 0.61 - 1.24 mg/dL   Calcium 9.1 8.9 - 09.6 mg/dL   Total Protein 8.5 (H) 6.5 - 8.1 g/dL   Albumin 4.9 3.5 - 5.0 g/dL   AST 23 15 - 41 U/L   ALT 18 0 - 44 U/L   Alkaline Phosphatase 83 38 - 126 U/L   Total Bilirubin 0.6 0.3 - 1.2 mg/dL   GFR calc non Af Amer >60 >60 mL/min   GFR calc Af Amer >60 >60 mL/min   Anion gap 8 5 - 15  Ethanol  Result Value Ref Range   Alcohol, Ethyl (B) <10 <10 mg/dL  Urine rapid drug screen (hosp performed)  Result Value Ref Range   Opiates NONE DETECTED NONE DETECTED   Cocaine NONE DETECTED NONE DETECTED   Benzodiazepines POSITIVE (A) NONE DETECTED   Amphetamines POSITIVE (A) NONE DETECTED   Tetrahydrocannabinol POSITIVE (A) NONE DETECTED   Barbiturates NONE DETECTED NONE DETECTED  CBC with Diff  Result Value Ref Range   WBC 10.8 (H) 4.0 - 10.5 K/uL   RBC 5.07 4.22 - 5.81 MIL/uL   Hemoglobin 15.1 13.0 - 17.0 g/dL   HCT 04.5 40.9 - 81.1 %   MCV 86.0 78.0 - 100.0 fL   MCH 29.8 26.0 - 34.0 pg   MCHC 34.6 30.0 - 36.0 g/dL   RDW 91.4 78.2 - 95.6 %   Platelets 193 150 - 400 K/uL   Neutrophils Relative % 72 %   Neutro Abs 7.7 1.7 - 7.7 K/uL   Lymphocytes Relative 21 %   Lymphs Abs 2.3 0.7 - 4.0 K/uL   Monocytes Relative 6 %   Monocytes Absolute 0.7 0.1 - 1.0 K/uL   Eosinophils Relative 1 %   Eosinophils Absolute 0.1 0.0 - 0.7 K/uL   Basophils Relative 0 %   Basophils Absolute 0.0 0.0 - 0.1 K/uL   No results found. Initial Impression / Assessment and Plan / ED Course  I have reviewed the triage vital signs and the nursing notes.  Pertinent labs & imaging results that were available during my care  of the patient were reviewed by me and considered in my medical decision making (see chart for details).     7:30 PM patient is alert Glasgow Coma Score 15 does not appear tremulous does not appear to be in withdrawal.  Plan is to follow-up with [facility] tomorrow.  Administered potassium chloride 40 mEq p.o. prior to discharge.  Given a meal while here. Patient is  medically cleared Final Clinical Impressions(s) / ED Diagnoses  Diagnoses #1 polysubstance abuse #2 hypokalemia Final diagnoses:  None    ED Discharge Orders    None       Doug Sou, MD 09/25/18 1948

## 2018-09-25 NOTE — ED Triage Notes (Signed)
Pt returns for assistance with substance abuse.  States he shot some meth earlier this morning.  Denies being suicidal.  Has found a rehab place [facility]) but needs to go through [facility] and is not sure how and needs referral.

## 2018-09-25 NOTE — Discharge Instructions (Addendum)
Follow-up with Larry Wilson as planned

## 2018-09-25 NOTE — ED Notes (Addendum)
Faxed labs to [facility] for medical clearance, fax confirmation printed and given to pt per pt request, security notified pt in lobby and will remain there until he can find a ride/place to stay tonight or until in the am when he's transported to [facility] by his Father

## 2018-09-26 ENCOUNTER — Encounter (HOSPITAL_COMMUNITY): Payer: Self-pay | Admitting: Emergency Medicine

## 2018-09-26 ENCOUNTER — Emergency Department (HOSPITAL_COMMUNITY)
Admission: EM | Admit: 2018-09-26 | Discharge: 2018-09-26 | Disposition: A | Discharge status: Home / Self Care | Payer: Self-pay | Attending: Emergency Medicine | Admitting: Emergency Medicine

## 2018-09-26 ENCOUNTER — Other Ambulatory Visit: Payer: Self-pay

## 2018-09-26 DIAGNOSIS — F1210 Cannabis abuse, uncomplicated: Secondary | ICD-10-CM | POA: Insufficient documentation

## 2018-09-26 DIAGNOSIS — F1510 Other stimulant abuse, uncomplicated: Secondary | ICD-10-CM | POA: Insufficient documentation

## 2018-09-26 DIAGNOSIS — F329 Major depressive disorder, single episode, unspecified: Secondary | ICD-10-CM | POA: Insufficient documentation

## 2018-09-26 DIAGNOSIS — R45851 Suicidal ideations: Secondary | ICD-10-CM | POA: Insufficient documentation

## 2018-09-26 DIAGNOSIS — F1914 Other psychoactive substance abuse with psychoactive substance-induced mood disorder: Secondary | ICD-10-CM | POA: Insufficient documentation

## 2018-09-26 DIAGNOSIS — F32A Depression, unspecified: Secondary | ICD-10-CM

## 2018-09-26 DIAGNOSIS — F17210 Nicotine dependence, cigarettes, uncomplicated: Secondary | ICD-10-CM | POA: Insufficient documentation

## 2018-09-26 DIAGNOSIS — F909 Attention-deficit hyperactivity disorder, unspecified type: Secondary | ICD-10-CM | POA: Insufficient documentation

## 2018-09-26 DIAGNOSIS — F419 Anxiety disorder, unspecified: Secondary | ICD-10-CM | POA: Insufficient documentation

## 2018-09-26 LAB — COMPREHENSIVE METABOLIC PANEL
ALT: 18 U/L (ref 0–44)
ANION GAP: 8 (ref 5–15)
AST: 20 U/L (ref 15–41)
Albumin: 4.6 g/dL (ref 3.5–5.0)
Alkaline Phosphatase: 90 U/L (ref 38–126)
BILIRUBIN TOTAL: 1 mg/dL (ref 0.3–1.2)
BUN: 11 mg/dL (ref 6–20)
CO2: 26 mmol/L (ref 22–32)
Calcium: 9.7 mg/dL (ref 8.9–10.3)
Chloride: 104 mmol/L (ref 98–111)
Creatinine, Ser: 0.95 mg/dL (ref 0.61–1.24)
Glucose, Bld: 107 mg/dL — ABNORMAL HIGH (ref 70–99)
POTASSIUM: 4.2 mmol/L (ref 3.5–5.1)
Sodium: 138 mmol/L (ref 135–145)
TOTAL PROTEIN: 8 g/dL (ref 6.5–8.1)

## 2018-09-26 LAB — RAPID URINE DRUG SCREEN, HOSP PERFORMED
Amphetamines: POSITIVE — AB
BENZODIAZEPINES: NOT DETECTED
Barbiturates: NOT DETECTED
COCAINE: NOT DETECTED
OPIATES: NOT DETECTED
Tetrahydrocannabinol: POSITIVE — AB

## 2018-09-26 LAB — CBC WITH DIFFERENTIAL/PLATELET
Abs Immature Granulocytes: 0.04 10*3/uL (ref 0.00–0.07)
BASOS PCT: 1 %
Basophils Absolute: 0.1 10*3/uL (ref 0.0–0.1)
EOS PCT: 1 %
Eosinophils Absolute: 0.1 10*3/uL (ref 0.0–0.5)
HEMATOCRIT: 46.1 % (ref 39.0–52.0)
Hemoglobin: 15.3 g/dL (ref 13.0–17.0)
Immature Granulocytes: 0 %
LYMPHS ABS: 1.7 10*3/uL (ref 0.7–4.0)
Lymphocytes Relative: 18 %
MCH: 29 pg (ref 26.0–34.0)
MCHC: 33.2 g/dL (ref 30.0–36.0)
MCV: 87.3 fL (ref 80.0–100.0)
MONOS PCT: 6 %
Monocytes Absolute: 0.6 10*3/uL (ref 0.1–1.0)
Neutro Abs: 6.9 10*3/uL (ref 1.7–7.7)
Neutrophils Relative %: 74 %
PLATELETS: 225 10*3/uL (ref 150–400)
RBC: 5.28 MIL/uL (ref 4.22–5.81)
RDW: 13.2 % (ref 11.5–15.5)
WBC: 9.3 10*3/uL (ref 4.0–10.5)
nRBC: 0 % (ref 0.0–0.2)

## 2018-09-26 LAB — ETHANOL

## 2018-09-26 MED ORDER — ACETAMINOPHEN 325 MG PO TABS
650.0000 mg | ORAL_TABLET | Freq: Once | ORAL | Status: AC
  Administered 2018-09-26: 650 mg via ORAL
  Filled 2018-09-26: qty 2

## 2018-09-26 NOTE — ED Triage Notes (Signed)
Pt was recently discharged last night for polysubstance abuse. Pt states today, I called [facility] and they blew me off and I dont know what to do.  Pt states have SI thoughts and states " I wish I was dead". Denies doing drugs in 24 hours.

## 2018-09-26 NOTE — Progress Notes (Signed)
Per Shuvon Rankin, NP pt does not meet criteria for inpt admission. Pt is recommended to f/u with [facility]. TTS also provided additional resources for substance abuse facilities for the pt to contact via fax at [phone] in the event he is not able to get into [facility]. Pt's nurse Foye Deer, RN in the process of contacting [facility] to confirm if the pt will be accepted for treatment. ED staff contacted and updated on disposition plan.   Princess Bruins, MSW, LCSW Therapeutic Triage Specialist  [phone]

## 2018-09-26 NOTE — ED Notes (Signed)
tts in progress 

## 2018-09-26 NOTE — ED Notes (Signed)
Spoke with intake at [facility] and was told the nurse had to review patient's chart and she will call RN back.

## 2018-09-26 NOTE — Consult Note (Signed)
  Tele psych Assessment   Larry Wilson, 27 y.o., male patient seen via tele psych by TTS and this provider; chart reviewed and consulted with Dr. Lucianne Muss on 09/26/18.  On evaluation Larry Wilson reports he is looking for rehab services.  States he was suppose to come to ED for evaluation of self injury and then get cleared.  States that he spoke with a nurse at [facility] and was told that they would get back in touch with him if he was accepted.  Patient states that he has not heard anything back.  Patient denies suicidal/self-harm/homicidal ideation, psychosis, and paranoia.  Patient states that he is interested in any place that can help him.   During evaluation Larry Wilson is alert/oriented x 4; calm/cooperative; and mood is congruent with affect.  He does not appear to be responding to internal/external stimuli or delusional thoughts; and denies suicidal/self-harm/homicidal ideation, psychosis, and paranoia.  Patient answered question appropriately.  For detailed note see TTS tele assessment note  Recommendations: Referral/resources for community substance abuse services and short/long term rehab services; also sent information on rehab facilities that were discussed [facility], [facility]; [facility]; [facility]; [facility].)  Disposition:  Patient is psychiatrically cleared No evidence of imminent risk to self or others at present.   Patient does not meet criteria for psychiatric inpatient admission. Supportive therapy provided about ongoing stressors. Discussed crisis plan, support from social network, calling 911, coming to the Emergency Department, and calling Suicide Hotline.   Spoke with Larry Au, RN; she was informed of above recommendation and disposition; states that she will inform Dr. Alfredo Martinez, NP
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
## 2018-09-26 NOTE — ED Notes (Signed)
Pt given resources faxed over for [facility] by [facility].

## 2018-09-26 NOTE — ED Provider Notes (Addendum)
[facility] EMERGENCY DEPARTMENT Provider Note   CSN: [id number] Arrival date & time: 09/26/18  4098     History   Chief Complaint Chief Complaint  Patient presents with  . Suicidal  . Addiction Problem    HPI Larry Wilson is a 27 y.o. male.  Level 5 caveat for acuity of condition. Third visit to the emergency department this week for mental health related issues.  Patient has a known history of methamphetamine abuse (intravenously), depression, anxiety, ADHD.  He apparently attempted to enroll in [facility], but they "blew me off".  He complains of depression and suicidal ideation.     Past Medical History:  Diagnosis Date  . ADHD (attention deficit hyperactivity disorder)   . Anxiety   . Depression     There are no active problems to display for this patient.   History reviewed. No pertinent surgical history.      Home Medications    Prior to Admission medications   Not on File    Family History Family History  Problem Relation Age of Onset  . Diabetes Other     Social History Social History   Tobacco Use  . Smoking status: Current Every Day Smoker    Packs/day: 0.50    Years: 5.00    Pack years: 2.50    Types: Cigarettes  . Smokeless tobacco: Never Used  Substance Use Topics  . Alcohol use: No  . Drug use: Yes    Types: Methamphetamines, IV, Cocaine, Marijuana    Comment: last used today     Allergies   Flexeril [cyclobenzaprine]; Hydrocodone; and Tramadol   Review of Systems Review of Systems  Unable to perform ROS: Psychiatric disorder     Physical Exam Updated Vital Signs BP (!) 139/94 (BP Location: Right Arm)   Pulse (!) 116   Temp 98.3 F (36.8 C) (Oral)   Resp 20   Ht 5\' 10"  (1.778 m)   Wt 61.2 kg   SpO2 100%   BMI 19.37 kg/m   Physical Exam  Constitutional: He is oriented to person, place, and time.  Agitated  HENT:  Head: Normocephalic and atraumatic.  Eyes: Conjunctivae are normal.  Neck: Neck supple.    Cardiovascular: Normal rate and regular rhythm.  Pulmonary/Chest: Effort normal and breath sounds normal.  Abdominal: Soft. Bowel sounds are normal.  Musculoskeletal: Normal range of motion.  Neurological: He is alert and oriented to person, place, and time.  Skin:  Track marks on arm  Psychiatric:  Pressured speech, flight of ideas  Nursing note and vitals reviewed.    ED Treatments / Results  Labs (all labs ordered are listed, but only abnormal results are displayed) Labs Reviewed  COMPREHENSIVE METABOLIC PANEL - Abnormal; Notable for the following components:      Result Value   Glucose, Bld 107 (*)    All other components within normal limits  RAPID URINE DRUG SCREEN, HOSP PERFORMED - Abnormal; Notable for the following components:   Amphetamines POSITIVE (*)    Tetrahydrocannabinol POSITIVE (*)    All other components within normal limits  CBC WITH DIFFERENTIAL/PLATELET  ETHANOL    EKG None  Radiology No results found.  Procedures Procedures (including critical care time)  Medications Ordered in ED Medications  acetaminophen (TYLENOL) tablet 650 mg (650 mg Oral Given 09/26/18 1149)     Initial Impression / Assessment and Plan / ED Course  I have reviewed the triage vital signs and the nursing notes.  Pertinent labs &  imaging results that were available during my care of the patient were reviewed by me and considered in my medical decision making (see chart for details).     Patient has a known history of anxiety, depression, ADHD, methamphetamine abuse.  He is now depressed and suicidal.  Third visit to the ED this week.  Will obtain behavioral health consult.   Behavioral health consult obtained.  Patient will follow-up with the [facility] rescue [facility]. Final Clinical Impressions(s) / ED Diagnoses   Final diagnoses:  Methamphetamine abuse (HCC)  Attention deficit hyperactivity disorder (ADHD), unspecified ADHD type  Depression, unspecified depression  type  Anxiety    ED Discharge Orders    None       Donnetta Hutching, MD 09/26/18 1050    Donnetta Hutching, MD 09/26/18 1256

## 2018-09-26 NOTE — ED Notes (Signed)
Father called back requesting referral and other paperwork be sent to [facility].  Spoke with Carney Bern SW at [facility] and she will send appropriate info.  Notified father that referral was sent and they will contact pt if he meets criteria and if they have available beds.  Per Assunta Found NP, [facility] will help pt if he goes there but father says he doesn't need to go to a homeless shelter.  Informed him again that the NP said they will help pt get the help he needs but father says he doesn't need to go there.  Pt also aware of [facility].  Will inform pt that referral was sent to [facility] if he is still in ER waiting room.

## 2018-09-26 NOTE — Progress Notes (Signed)
CSW was requested to send referral information to [facility].  Completed referral per request.  Carney Bern T. Kaylyn Lim, MSW, LCSWA Disposition Clinical Social Work [phone] (cell) [phone] (office)

## 2018-09-26 NOTE — Discharge Instructions (Addendum)
Follow-up at [facility]

## 2018-10-01 ENCOUNTER — Encounter (HOSPITAL_COMMUNITY): Payer: Self-pay | Admitting: Emergency Medicine

## 2018-10-01 ENCOUNTER — Other Ambulatory Visit: Payer: Self-pay

## 2018-10-01 ENCOUNTER — Emergency Department (HOSPITAL_COMMUNITY)
Admission: EM | Admit: 2018-10-01 | Discharge: 2018-10-01 | Disposition: A | Discharge status: Home / Self Care | Payer: Self-pay | Attending: Emergency Medicine | Admitting: Emergency Medicine

## 2018-10-01 DIAGNOSIS — F1910 Other psychoactive substance abuse, uncomplicated: Secondary | ICD-10-CM | POA: Insufficient documentation

## 2018-10-01 DIAGNOSIS — F17210 Nicotine dependence, cigarettes, uncomplicated: Secondary | ICD-10-CM | POA: Insufficient documentation

## 2018-10-01 NOTE — ED Provider Notes (Signed)
[facility] EMERGENCY DEPARTMENT Provider Note   CSN: [id number] Arrival date & time: 10/01/18  2024     History   Chief Complaint Chief Complaint  Patient presents with  . Medical Clearance    HPI Larry Wilson is a 27 y.o. male.  HPI   27 year old male resenting for evaluation she is try to go to a drug rehab facility.  He has a history of drug abuse.  He was recently seen in the emergency room for the same.  When staff was doing an intake on him they noted that he was complaints of chest tightness and shortness of breath on his ER visit.  This has since resolved.  Patient attributes this to "coming down from the drugs" and "anxiety."  He has not had a recurrence since then.  Past Medical History:  Diagnosis Date  . ADHD (attention deficit hyperactivity disorder)   . Anxiety   . Depression     There are no active problems to display for this patient.   History reviewed. No pertinent surgical history.      Home Medications    Prior to Admission medications   Not on File    Family History Family History  Problem Relation Age of Onset  . Diabetes Other     Social History Social History   Tobacco Use  . Smoking status: Current Every Day Smoker    Packs/day: 0.50    Years: 5.00    Pack years: 2.50    Types: Cigarettes  . Smokeless tobacco: Never Used  Substance Use Topics  . Alcohol use: No  . Drug use: Yes    Types: Methamphetamines, IV, Cocaine, Marijuana    Comment: last used today     Allergies   Flexeril [cyclobenzaprine]; Hydrocodone; and Tramadol   Review of Systems Review of Systems  All systems reviewed and negative, other than as noted in HPI.  Physical Exam Updated Vital Signs BP 137/86 (BP Location: Left Arm)   Pulse (!) 103   Temp 98 F (36.7 C) (Oral)   Resp 17   Ht 5\' 10"  (1.778 m)   Wt 61 kg   SpO2 100%   BMI 19.30 kg/m   Physical Exam  Constitutional: He appears well-developed and well-nourished. No distress.    HENT:  Head: Normocephalic and atraumatic.  Eyes: Conjunctivae are normal. Right eye exhibits no discharge. Left eye exhibits no discharge.  Neck: Neck supple.  Cardiovascular: Normal rate, regular rhythm and normal heart sounds. Exam reveals no gallop and no friction rub.  No murmur heard. Pulmonary/Chest: Effort normal and breath sounds normal. No respiratory distress.  Abdominal: Soft. He exhibits no distension. There is no tenderness.  Musculoskeletal: He exhibits no edema or tenderness.  Lower extremities symmetric as compared to each other. No calf tenderness. Negative Homan's. No palpable cords.   Neurological: He is alert.  Skin: Skin is warm and dry.  Psychiatric: He has a normal mood and affect. His behavior is normal. Thought content normal.  Nursing note and vitals reviewed.    ED Treatments / Results  Labs (all labs ordered are listed, but only abnormal results are displayed) Labs Reviewed - No data to display  EKG EKG Interpretation  Date/Time:  Sunday October 01 2018 21:00:05 EDT Ventricular Rate:  97 PR Interval:    QRS Duration: 86 QT Interval:  330 QTC Calculation: 420 R Axis:   87 Text Interpretation:  Sinus rhythm Right atrial enlargement Anterolateral Q wave, probably normal for age  Confirmed by Raeford Razor [phone]) on 10/01/2018 9:06:50 PM   Radiology No results found.  Procedures Procedures (including critical care time)  Medications Ordered in ED Medications - No data to display   Initial Impression / Assessment and Plan / ED Course  I have reviewed the triage vital signs and the nursing notes.  Pertinent labs & imaging results that were available during my care of the patient were reviewed by me and considered in my medical decision making (see chart for details).     27 year old male with history of drug abuse.  He standing in a rehab facility.  He has some chest tightness and shortness of breath on recent ER evaluation but the  symptoms are consistent with anxiety.  I doubt emergent process.  EKG was obtained today which does not show any concerning findings.  I feel he is appropriate to participate in any drug rehabilitation program on either an inpatient or outpatient basis.  Final Clinical Impressions(s) / ED Diagnoses   Final diagnoses:  Substance abuse [facility])    ED Discharge Orders    None       Raeford Razor, MD 10/01/18 2112

## 2018-10-01 NOTE — ED Triage Notes (Addendum)
Pt states he needs an EKG to be cleared for admission to "[facility], which is an inpatient detox facility." Pt states he is not having any pain at this time.

## 2019-06-21 ENCOUNTER — Other Ambulatory Visit: Payer: Self-pay

## 2019-06-21 DIAGNOSIS — Z20822 Contact with and (suspected) exposure to covid-19: Secondary | ICD-10-CM

## 2019-06-28 LAB — NOVEL CORONAVIRUS, NAA: SARS-CoV-2, NAA: NOT DETECTED

## 2019-10-02 ENCOUNTER — Other Ambulatory Visit: Payer: Self-pay

## 2019-10-02 DIAGNOSIS — Z20822 Contact with and (suspected) exposure to covid-19: Secondary | ICD-10-CM

## 2019-10-04 LAB — NOVEL CORONAVIRUS, NAA: SARS-CoV-2, NAA: NOT DETECTED

## 2019-10-10 ENCOUNTER — Telehealth: Payer: Self-pay | Admitting: Hematology

## 2019-10-10 NOTE — Telephone Encounter (Signed)
Pt is aware covid 19 result is negative °

## 2020-05-28 ENCOUNTER — Other Ambulatory Visit: Payer: Self-pay

## 2020-05-28 ENCOUNTER — Ambulatory Visit
Admission: EM | Admit: 2020-05-28 | Discharge: 2020-05-28 | Disposition: A | Discharge status: Home / Self Care | Payer: Self-pay

## 2020-05-28 DIAGNOSIS — L089 Local infection of the skin and subcutaneous tissue, unspecified: Secondary | ICD-10-CM

## 2020-05-28 DIAGNOSIS — T148XXA Other injury of unspecified body region, initial encounter: Secondary | ICD-10-CM

## 2020-05-28 NOTE — Discharge Instructions (Signed)
Clean with warm water and mild soap.  Avoid submerging wound in water. Change dressing daily and apply a thin layer of neosporin.    Take OTC ibuprofen or tylenol as needed for pain releif Return sooner or go to the ED if you have any new or worsening symptoms such as increased pain, redness, swelling, drainage, discharge, decreased range of motion of extremity, etc..

## 2020-05-28 NOTE — ED Provider Notes (Signed)
[facility]   [id number] 05/28/20 Arrival Time: 1350  Chief Complaint  Patient presents with  . Foot Pain    SUBJECTIVE:  Larry Wilson is a 29 y.o. male who presents to the urgent care with a complaint of right foot pain after stepping on glass this past Saturday.  Reports redness and increased pain the past few days but now symptom has almost resolved.  He is here for work note to return to work.  Currently not on blood thinners.  Denies similar symptoms in the past.  Denies fever, chills, nausea, vomiting, redness, swelling, purulent drainage, decrease strength or sensation.   Td UTD: Yes.  ROS: As per HPI.  All other pertinent ROS negative.     Past Medical History:  Diagnosis Date  . ADHD (attention deficit hyperactivity disorder)   . Anxiety   . Depression    History reviewed. No pertinent surgical history. Allergies  Allergen Reactions  . Flexeril [Cyclobenzaprine] Other (See Comments)    Headaches, irritable, changes mood.  . Hydrocodone Itching  . Tramadol Other (See Comments)    Burning Sensation    No current facility-administered medications on file prior to encounter.   No current outpatient medications on file prior to encounter.   Social History   Socioeconomic History  . Marital status: Single    Spouse name: Not on file  . Number of children: Not on file  . Years of education: Not on file  . Highest education level: Not on file  Occupational History  . Not on file  Tobacco Use  . Smoking status: Current Every Day Smoker    Packs/day: 0.50    Years: 5.00    Pack years: 2.50    Types: Cigarettes  . Smokeless tobacco: Never Used  Substance and Sexual Activity  . Alcohol use: No  . Drug use: Yes    Types: Methamphetamines, IV, Cocaine, Marijuana    Comment: last used today  . Sexual activity: Yes    Birth control/protection: None  Other Topics Concern  . Not on file  Social History Narrative  . Not on file   Social  Determinants of Health   Financial Resource Strain:   . Difficulty of Paying Living Expenses:   Food Insecurity:   . Worried About Programme researcher, broadcasting/film/video in the Last Year:   . Barista in the Last Year:   Transportation Needs:   . Freight forwarder (Medical):   Marland Kitchen Lack of Transportation (Non-Medical):   Physical Activity:   . Days of Exercise per Week:   . Minutes of Exercise per Session:   Stress:   . Feeling of Stress :   Social Connections:   . Frequency of Communication with Friends and Family:   . Frequency of Social Gatherings with Friends and Family:   . Attends Religious Services:   . Active Member of Clubs or Organizations:   . Attends Banker Meetings:   Marland Kitchen Marital Status:   Intimate Partner Violence:   . Fear of Current or Ex-Partner:   . Emotionally Abused:   Marland Kitchen Physically Abused:   . Sexually Abused:    Family History  Problem Relation Age of Onset  . Diabetes Other      OBJECTIVE:  Vitals:   05/28/20 1359  BP: 132/81  Pulse: 82  Resp: 18  Temp: (!) 97.4 F (36.3 C)  SpO2: 98%     Physical Exam Vitals and nursing note reviewed.  Constitutional:  General: He is not in acute distress.    Appearance: Normal appearance. He is normal weight. He is not ill-appearing or toxic-appearing.  Cardiovascular:     Rate and Rhythm: Normal rate and regular rhythm.     Pulses: Normal pulses.     Heart sounds: Normal heart sounds. No murmur. No gallop.   Pulmonary:     Effort: Pulmonary effort is normal. No respiratory distress.     Breath sounds: Normal breath sounds. No stridor. No wheezing, rhonchi or rales.  Chest:     Chest wall: No tenderness.  Skin:    General: Skin is warm.     Capillary Refill: Capillary refill takes less than 2 seconds.     Findings: Wound present.     Comments: Small wound size of a dime present at the bottom of the right foot.  Neurological:     Mental Status: He is alert and oriented to person, place,  and time.     Cranial Nerves: No cranial nerve deficit.     Sensory: No sensory deficit.     Motor: No weakness.     Coordination: Coordination normal.     Gait: Gait normal.     Deep Tendon Reflexes: Reflexes normal.      Results for orders placed or performed in visit on 10/02/19  Novel Coronavirus, NAA ([facility])   Specimen: Nasopharyngeal(NP) swabs in vial transport medium   NASOPHARYNGE  TESTING  Result Value Ref Range   SARS-CoV-2, NAA Not Detected Not Detected    Labs Reviewed - No data to display  No results found.  Procedure:   ASSESSMENT & PLAN:  1. Wound infection    Patient is stable at discharge and wound is healing well.  No infection at this time.  Work note was given.  He was advised to to keep the wound clean and continue to use Neosporin ointment.  No orders of the defined types were placed in this encounter.   Discharge instructions  Clean with warm water and mild soap.  Avoid submerging wound in water. Change dressing daily and apply a thin layer of neosporin.    Take OTC ibuprofen or tylenol as needed for pain releif Return sooner or go to the ED if you have any new or worsening symptoms such as increased pain, redness, swelling, drainage, discharge, decreased range of motion of extremity, etc..     Reviewed expectations re: course of current medical issues. Questions answered. Outlined signs and symptoms indicating need for more acute intervention. Patient verbalized understanding. After Visit Summary given.   Emerson Monte, FNP 05/28/20 1436

## 2020-05-28 NOTE — ED Triage Notes (Signed)
Right foot pain after stepping on glass on Saturday, patient states he dug glass out of foot himself but is concerned it is infected due to him working outside afterwards. Called into work today, needing work note.

## 2020-06-09 ENCOUNTER — Encounter: Payer: Self-pay | Admitting: Emergency Medicine

## 2020-06-09 ENCOUNTER — Ambulatory Visit
Admission: EM | Admit: 2020-06-09 | Discharge: 2020-06-09 | Disposition: A | Discharge status: Home / Self Care | Payer: Self-pay | Attending: Emergency Medicine | Admitting: Emergency Medicine

## 2020-06-09 ENCOUNTER — Other Ambulatory Visit: Payer: Self-pay

## 2020-06-09 DIAGNOSIS — M5441 Lumbago with sciatica, right side: Secondary | ICD-10-CM

## 2020-06-09 MED ORDER — BACLOFEN 5 MG PO TABS: 5.0000 mg | ORAL_TABLET | Freq: Two times a day (BID) | ORAL | 0 refills | Status: DC

## 2020-06-09 MED ORDER — PREDNISONE 20 MG PO TABS: 20.0000 mg | ORAL_TABLET | Freq: Two times a day (BID) | ORAL | 0 refills | Status: AC

## 2020-06-09 MED ORDER — METHOCARBAMOL 500 MG PO TABS: 500.0000 mg | ORAL_TABLET | Freq: Two times a day (BID) | ORAL | 0 refills | Status: DC

## 2020-06-09 NOTE — Discharge Instructions (Addendum)
Continue conservative management of rest, ice, and gentle stretches Prednisone prescribed.  Take as directed and to completion Take baclofen at nighttime for symptomatic relief. Avoid driving or operating heavy machinery while using medication. Follow up with PCP if symptoms persist Return or go to the ER if you have any new or worsening symptoms (fever, chills, chest pain, abdominal pain, changes in bowel or bladder habits, pain radiating into lower legs, etc...)

## 2020-06-09 NOTE — ED Provider Notes (Signed)
[facility]   [id number] 06/09/20 Arrival Time: 1542  CC: Back PAIN  SUBJECTIVE: History from: patient. Larry Wilson is a 29 y.o. male complains of acute on chronic low back pain with recent flare x few day.  Denies a precipitating event or specific injury.  States he is using his back muscles with his new job.  Does landscaping.  Localizes the pain to the RT low back and radiates into RT leg.  Describes the pain as intermittent and dull and sharp/ burning in character.  Has tried OTC medications without relief.  Symptoms are made worse with getting in out of the truck.  Reports similar symptoms in the past.  Denies fever, chills, erythema, ecchymosis, effusion, weakness, numbness and tingling, saddle paresthesias, loss of bowel or bladder function.      Needs work note  ROS: As per HPI.  All other pertinent ROS negative.     Past Medical History:  Diagnosis Date  . ADHD (attention deficit hyperactivity disorder)   . Anxiety   . Depression    History reviewed. No pertinent surgical history. Allergies  Allergen Reactions  . Flexeril [Cyclobenzaprine] Other (See Comments)    Headaches, irritable, changes mood.  . Hydrocodone Itching  . Tramadol Other (See Comments)    Burning Sensation    No current facility-administered medications on file prior to encounter.   No current outpatient medications on file prior to encounter.   Social History   Socioeconomic History  . Marital status: Single    Spouse name: Not on file  . Number of children: Not on file  . Years of education: Not on file  . Highest education level: Not on file  Occupational History  . Not on file  Tobacco Use  . Smoking status: Current Every Day Smoker    Packs/day: 0.50    Years: 5.00    Pack years: 2.50    Types: Cigarettes  . Smokeless tobacco: Never Used  Substance and Sexual Activity  . Alcohol use: Yes    Comment: occ  . Drug use: Not Currently    Types: Methamphetamines, IV,  Cocaine, Marijuana    Comment: last used today  . Sexual activity: Yes    Birth control/protection: None  Other Topics Concern  . Not on file  Social History Narrative  . Not on file   Social Determinants of Health   Financial Resource Strain:   . Difficulty of Paying Living Expenses:   Food Insecurity:   . Worried About Programme researcher, broadcasting/film/video in the Last Year:   . Barista in the Last Year:   Transportation Needs:   . Freight forwarder (Medical):   Marland Kitchen Lack of Transportation (Non-Medical):   Physical Activity:   . Days of Exercise per Week:   . Minutes of Exercise per Session:   Stress:   . Feeling of Stress :   Social Connections:   . Frequency of Communication with Friends and Family:   . Frequency of Social Gatherings with Friends and Family:   . Attends Religious Services:   . Active Member of Clubs or Organizations:   . Attends Banker Meetings:   Marland Kitchen Marital Status:   Intimate Partner Violence:   . Fear of Current or Ex-Partner:   . Emotionally Abused:   Marland Kitchen Physically Abused:   . Sexually Abused:    Family History  Problem Relation Age of Onset  . Diabetes Other     OBJECTIVE:  Vitals:  06/09/20 1552 06/09/20 1555  BP:  125/78  Pulse:  98  Resp:  19  Temp:  98.3 F (36.8 C)  TempSrc:  Oral  SpO2:  97%  Weight: 160 lb (72.6 kg)   Height: 5\' 10"  (1.778 m)     General appearance: ALERT; in no acute distress.  Head: NCAT Lungs: Normal respiratory effort; CTAB CV: RRR Musculoskeletal: Back  Inspection: Skin warm, dry, clear and intact without obvious erythema, effusion, or ecchymosis.  Palpation: TTP over RT low back ROM: FROM active and passive Strength: 5/5 shld abduction, 5/5 shld adduction, 5/5 elbow flexion, 5/5 elbow extension, 5/5 grip strength, 5/5 hip flexion, 5/5 hip extension, knee flexion, 5/5 knee extension Skin: warm and dry Neurologic: Ambulates without difficulty; Sensation intact about the upper/ lower  extremities Psychological: alert and cooperative; normal mood and affect  ASSESSMENT & PLAN:  1. Acute right-sided low back pain with right-sided sciatica     Meds ordered this encounter  Medications  . predniSONE (DELTASONE) 20 MG tablet    Sig: Take 1 tablet (20 mg total) by mouth 2 (two) times daily with a meal for 5 days.    Dispense:  10 tablet    Refill:  0    Order Specific Question:   Supervising Provider    Answer:   Raylene Everts [[id number]]  . methocarbamol (ROBAXIN) 500 MG tablet    Sig: Take 1 tablet (500 mg total) by mouth 2 (two) times daily.    Dispense:  20 tablet    Refill:  0    Order Specific Question:   Supervising Provider    Answer:   Raylene Everts [[id number]]   Continue conservative management of rest, ice, and gentle stretches Prednisone prescribed.  Take as directed and to completion Take robaxin at nighttime for symptomatic relief. Avoid driving or operating heavy machinery while using medication. Follow up with PCP if symptoms persist Return or go to the ER if you have any new or worsening symptoms (fever, chills, chest pain, abdominal pain, changes in bowel or bladder habits, pain radiating into lower legs, etc...)   Reviewed expectations re: course of current medical issues. Questions answered. Outlined signs and symptoms indicating need for more acute intervention. Patient verbalized understanding. After Visit Summary given.    Lestine Box, PA-C 06/09/20 1626

## 2020-06-09 NOTE — ED Triage Notes (Addendum)
Low back pain x 3 years.  His new job he is having to use his back muscles more.  Pain has flared up worse now.  Pt states he had to miss work today.

## 2020-06-16 ENCOUNTER — Ambulatory Visit
Admission: EM | Admit: 2020-06-16 | Discharge: 2020-06-16 | Disposition: A | Discharge status: Home / Self Care | Payer: Self-pay | Attending: Emergency Medicine | Admitting: Emergency Medicine

## 2020-06-16 DIAGNOSIS — M5441 Lumbago with sciatica, right side: Secondary | ICD-10-CM

## 2020-06-16 MED ORDER — METHYLPREDNISOLONE SODIUM SUCC 125 MG IJ SOLR
80.0000 mg | Freq: Once | INTRAMUSCULAR | Status: AC
  Administered 2020-06-16: 80 mg via INTRAMUSCULAR

## 2020-06-16 NOTE — Discharge Instructions (Addendum)
Take your medication as prescribed Follow-up with PCP for further evaluation and or referral Return or go to ED for worsening of symptoms

## 2020-06-16 NOTE — ED Triage Notes (Signed)
Pt continues to have back pain, unable to fill prescription from last visit

## 2020-06-16 NOTE — ED Provider Notes (Signed)
[place]   [id number] 06/16/20 Arrival Time: 1502  Chief Complaint  Patient presents with  . Back Pain     SUBJECTIVE: History from: patient.  Larry Wilson is a 29 y.o. male who presented to the urgent care with a complaint of chronic low back pain for the past few days.  Was seen on 06/09/20 and was prescribed prednisone, Robaxin, and baclofen.  Reported he was unable to get his medication due to money.  Stated he will be able to get it this coming Thursday.  He localizes pain to to the right low back that radiates to right leg.  Working Biomedical scientist.  Reports similar symptoms in the past.  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  Denies fever, chills, erythema, ecchymosis, effusion, weakness, numbness, tingling, paresthesia, loss of bladder and bowel function.  ROS: As per HPI.  All other pertinent ROS negative.     Past Medical History:  Diagnosis Date  . ADHD (attention deficit hyperactivity disorder)   . Anxiety   . Depression    History reviewed. No pertinent surgical history. Allergies  Allergen Reactions  . Flexeril [Cyclobenzaprine] Other (See Comments)    Headaches, irritable, changes mood.  . Hydrocodone Itching  . Tramadol Other (See Comments)    Burning Sensation    No current facility-administered medications on file prior to encounter.   Current Outpatient Medications on File Prior to Encounter  Medication Sig Dispense Refill  . baclofen 5 MG TABS Take 5 mg by mouth 2 (two) times daily. 15 tablet 0   Social History   Socioeconomic History  . Marital status: Single    Spouse name: Not on file  . Number of children: Not on file  . Years of education: Not on file  . Highest education level: Not on file  Occupational History  . Not on file  Tobacco Use  . Smoking status: Current Every Day Smoker    Packs/day: 0.50    Years: 5.00    Pack years: 2.50    Types: Cigarettes  . Smokeless tobacco: Never Used  Substance and  Sexual Activity  . Alcohol use: Yes    Comment: occ  . Drug use: Not Currently    Types: Methamphetamines, IV, Cocaine, Marijuana    Comment: last used today  . Sexual activity: Yes    Birth control/protection: None  Other Topics Concern  . Not on file  Social History Narrative  . Not on file   Social Determinants of Health   Financial Resource Strain:   . Difficulty of Paying Living Expenses:   Food Insecurity:   . Worried About Charity fundraiser in the Last Year:   . Arboriculturist in the Last Year:   Transportation Needs:   . Film/video editor (Medical):   Marland Kitchen Lack of Transportation (Non-Medical):   Physical Activity:   . Days of Exercise per Week:   . Minutes of Exercise per Session:   Stress:   . Feeling of Stress :   Social Connections:   . Frequency of Communication with Friends and Family:   . Frequency of Social Gatherings with Friends and Family:   . Attends Religious Services:   . Active Member of Clubs or Organizations:   . Attends Archivist Meetings:   Marland Kitchen Marital Status:   Intimate Partner Violence:   . Fear of Current or Ex-Partner:   . Emotionally Abused:   Marland Kitchen Physically Abused:   .  Sexually Abused:    Family History  Problem Relation Age of Onset  . Diabetes Other     OBJECTIVE:  Vitals:   06/16/20 1510  BP: 117/74  Pulse: 75  Resp: 16  Temp: 98 F (36.7 C)  TempSrc: Oral  SpO2: 98%     Physical Exam Vitals and nursing note reviewed.  Constitutional:      General: He is not in acute distress.    Appearance: Normal appearance. He is normal weight. He is not ill-appearing, toxic-appearing or diaphoretic.  Cardiovascular:     Rate and Rhythm: Normal rate and regular rhythm.     Pulses: Normal pulses.     Heart sounds: Normal heart sounds. No murmur heard.  No friction rub. No gallop.   Pulmonary:     Effort: Pulmonary effort is normal. No respiratory distress.     Breath sounds: Normal breath sounds. No stridor. No  wheezing, rhonchi or rales.  Chest:     Chest wall: No tenderness.  Musculoskeletal:        General: Tenderness present. No swelling, deformity or signs of injury.     Lumbar back: Spasms and tenderness present.     Left lower leg: No edema.     Comments: Back:  Patient ambulates from chair to exam table without difficulty.  Inspection: Skin clear and intact without obvious swelling, erythema, or ecchymosis. Warm to the touch  Palpation: Vertebral processes nontender. Tenderness about the right lower muscles  Special Tests: Negative Straight leg raise  Neurological:     Mental Status: He is alert and oriented to person, place, and time.     Cranial Nerves: No cranial nerve deficit.     Sensory: No sensory deficit.     Motor: No weakness.     Coordination: Coordination normal.     LABS:  No results found for this or any previous visit (from the past 24 hour(s)).   ASSESSMENT & PLAN:  1. Acute right-sided low back pain with right-sided sciatica     No orders of the defined types were placed in this encounter.   Discharge Instructions Take your medication as prescribed Follow-up with PCP for further evaluation and or referral Return or go to ED for worsening of symptoms   Reviewed expectations re: course of current medical issues. Questions answered. Outlined signs and symptoms indicating need for more acute intervention. Patient verbalized understanding. After Visit Summary given.         Durward Parcel, FNP 06/16/20 1544

## 2020-10-27 ENCOUNTER — Ambulatory Visit
Admission: EM | Admit: 2020-10-27 | Discharge: 2020-10-27 | Disposition: A | Discharge status: Home / Self Care | Payer: Self-pay | Attending: Emergency Medicine | Admitting: Emergency Medicine

## 2020-10-27 ENCOUNTER — Other Ambulatory Visit: Payer: Self-pay

## 2020-10-27 ENCOUNTER — Encounter: Payer: Self-pay | Admitting: Emergency Medicine

## 2020-10-27 DIAGNOSIS — K0889 Other specified disorders of teeth and supporting structures: Secondary | ICD-10-CM

## 2020-10-27 DIAGNOSIS — K047 Periapical abscess without sinus: Secondary | ICD-10-CM

## 2020-10-27 DIAGNOSIS — R519 Headache, unspecified: Secondary | ICD-10-CM

## 2020-10-27 DIAGNOSIS — S032XXA Dislocation of tooth, initial encounter: Secondary | ICD-10-CM

## 2020-10-27 MED ORDER — AMOXICILLIN-POT CLAVULANATE 875-125 MG PO TABS: 1.0000 | ORAL_TABLET | Freq: Two times a day (BID) | ORAL | 0 refills | Status: AC

## 2020-10-27 MED ORDER — MELOXICAM 15 MG PO TABS: 15.0000 mg | ORAL_TABLET | Freq: Every day | ORAL | 0 refills | Status: DC

## 2020-10-27 MED ORDER — CHLORHEXIDINE GLUCONATE 0.12 % MT SOLN: 15.0000 mL | Freq: Two times a day (BID) | OROMUCOSAL | 0 refills | Status: DC

## 2020-10-27 NOTE — Discharge Instructions (Addendum)
Recommend a soft diet  Maintain proper dental hygiene peridex mouthwash prescribed mobic prescribed. Use as directed for pain relief Augmentin prescribed.  Take as directed and to completion Follow up with dentist as soon as possible for further evaluation and treatment  Return or go to the ER if you have any new or worsening symptoms such as fever, chills, nausea, vomiting, chest pain, shortness of breath, cough, vision changes, worsening headache despite treatment, slurred speech, facial asymmetry, weakness in arms or legs, etc..Marland Kitchen

## 2020-10-27 NOTE — ED Triage Notes (Signed)
Headaches on the LT side of face, behind LT eye and to back of head x 2 weeks. Pt states he thinks its from his teeth, states he has a lot of tooth problems.

## 2020-10-27 NOTE — ED Provider Notes (Signed)
[facility]   [id number] 10/27/20 Arrival Time: 1400  MB:[id number]  SUBJECTIVE:  Larry Wilson is a 29 y.o. male who complains of intermittent headache x 2 weeks.  Symptoms began after breaking a back molar on his bottom LT jaw.  Patient localizes his pain to the LT cheek, jaw, behind LT eye, and radiates towards back of his head.  Describes the pain as intermittent and "piercing" in character.  Patient has tried OTC ibuprofen/ tylenol with relief. Concerned for infection.  Symptoms are made worse with cold.  Reports similar symptoms in the past with dental avulsion.  Complains of mild nausea.  Patient denies fever, chills, vomiting, rhinorrhea, watery eyes, chest pain, SOB, abdominal pain, weakness, numbness or tingling, slurred speech.    ROS: As per HPI.  All other pertinent ROS negative.     Past Medical History:  Diagnosis Date   ADHD (attention deficit hyperactivity disorder)    Anxiety    Depression    History reviewed. No pertinent surgical history. Allergies  Allergen Reactions   Flexeril [Cyclobenzaprine] Other (See Comments)    Headaches, irritable, changes mood.   Hydrocodone Itching   Tramadol Other (See Comments)    Burning Sensation    No current facility-administered medications on file prior to encounter.   No current outpatient medications on file prior to encounter.   Social History   Socioeconomic History   Marital status: Single    Spouse name: Not on file   Number of children: Not on file   Years of education: Not on file   Highest education level: Not on file  Occupational History   Not on file  Tobacco Use   Smoking status: Current Every Day Smoker    Packs/day: 0.50    Years: 5.00    Pack years: 2.50    Types: Cigarettes   Smokeless tobacco: Never Used  Substance and Sexual Activity   Alcohol use: Yes    Comment: occ   Drug use: Not Currently    Types: Methamphetamines, IV, Cocaine, Marijuana    Comment: does  not use drugs anymore    Sexual activity: Yes    Birth control/protection: None  Other Topics Concern   Not on file  Social History Narrative   Not on file   Social Determinants of Health   Financial Resource Strain:    Difficulty of Paying Living Expenses: Not on file  Food Insecurity:    Worried About Programme researcher, broadcasting/film/video in the Last Year: Not on file   [employer] of Food in the Last Year: Not on file  Transportation Needs:    Lack of Transportation (Medical): Not on file   Lack of Transportation (Non-Medical): Not on file  Physical Activity:    Days of Exercise per Week: Not on file   Minutes of Exercise per Session: Not on file  Stress:    Feeling of Stress : Not on file  Social Connections:    Frequency of Communication with Friends and Family: Not on file   Frequency of Social Gatherings with Friends and Family: Not on file   Attends Religious Services: Not on file   Active Member of Clubs or Organizations: Not on file   Attends Banker Meetings: Not on file   Marital Status: Not on file  Intimate Partner Violence:    Fear of Current or Ex-Partner: Not on file   Emotionally Abused: Not on file   Physically Abused: Not on file   Sexually  Abused: Not on file   Family History  Problem Relation Age of Onset   Diabetes Other     OBJECTIVE:  Vitals:   10/27/20 1417 10/27/20 1419  BP: 111/74   Pulse: 94   Resp: 18   Temp: 98.8 F (37.1 C)   TempSrc: Oral   SpO2: 97%   Weight:  158 lb 11.7 oz (72 kg)  Height:  5\' 10"  (1.778 m)    General appearance: alert; no distress Eyes: PERRLA; EOMI HENT: normocephalic; atraumatic; PERRL, EOMI grossly; nares patent; oropharynx clear, bottom back LT molar is partial tooth avulsion in 8-12 o'clock position, possible dental caries as well, no obvious swelling, bleeding or abscess Neck: supple with FROM Lungs: clear to auscultation bilaterally Heart: regular rate and rhythm.   Extremities: no  edema; symmetrical with no gross deformities Skin: warm and dry Neurologic: CN 2-12 grossly intact; finger to nose without difficulty; normal gait; strength and sensation intact bilaterally about the upper and lower extremities; negative pronator drift Psychological: alert and cooperative; normal mood and affect   ASSESSMENT & PLAN:  1. Acute nonintractable headache, unspecified headache type   2. Pain, dental   3. Tooth avulsion, initial encounter   4. Dental infection     Meds ordered this encounter  Medications   meloxicam (MOBIC) 15 MG tablet    Sig: Take 1 tablet (15 mg total) by mouth daily.    Dispense:  30 tablet    Refill:  0    Order Specific Question:   Supervising Provider    Answer:   10-12 Eustace Moore   chlorhexidine (PERIDEX) 0.12 % solution    Sig: Use as directed 15 mLs in the mouth or throat 2 (two) times daily.    Dispense:  473 mL    Refill:  0    Order Specific Question:   Supervising Provider    Answer:   [[id number]] Eustace Moore   amoxicillin-clavulanate (AUGMENTIN) 875-125 MG tablet    Sig: Take 1 tablet by mouth every 12 (twelve) hours for 10 days.    Dispense:  20 tablet    Refill:  0    Order Specific Question:   Supervising Provider    Answer:   [[id number]] Eustace Moore   Recommend a soft diet  Maintain proper dental hygiene peridex mouthwash prescribed mobic prescribed. Use as directed for pain relief Augmentin prescribed.  Take as directed and to completion Follow up with dentist as soon as possible for further evaluation and treatment  Return or go to the ER if you have any new or worsening symptoms such as fever, chills, nausea, vomiting, chest pain, shortness of breath, cough, vision changes, worsening headache despite treatment, slurred speech, facial asymmetry, weakness in arms or legs, etc...  Reviewed expectations re: course of current medical issues. Questions answered. Outlined signs and symptoms indicating need  for more acute intervention. Patient verbalized understanding. After Visit Summary given.   [[id number]], PA-C 10/27/20 1511

## 2021-05-21 ENCOUNTER — Ambulatory Visit
Admission: EM | Admit: 2021-05-21 | Discharge: 2021-05-21 | Disposition: A | Discharge status: Home / Self Care | Payer: Self-pay | Attending: Internal Medicine | Admitting: Internal Medicine

## 2021-05-21 ENCOUNTER — Other Ambulatory Visit: Payer: Self-pay

## 2021-05-21 DIAGNOSIS — M5416 Radiculopathy, lumbar region: Secondary | ICD-10-CM

## 2021-05-21 MED ORDER — IBUPROFEN 800 MG PO TABS: 800.0000 mg | ORAL_TABLET | Freq: Three times a day (TID) | ORAL | 0 refills | Status: DC

## 2021-05-21 MED ORDER — BACLOFEN 10 MG PO TABS: 10.0000 mg | ORAL_TABLET | Freq: Three times a day (TID) | ORAL | 0 refills | Status: DC

## 2021-05-21 NOTE — ED Provider Notes (Signed)
[facility]    CSN: [id number] Arrival date & time: 05/21/21  1551      History   Chief Complaint Chief Complaint  Patient presents with  . Back Pain    Pt states that he has lower back pain that travels down his legs. Pt states that he has a history of back pain.    HPI Larry Wilson is a 30 y.o. male who comes in due to back pain flair which has been going on x 3 years. Injured I when he was getting down from a bunk bed. His leg stippied and he carried the rack of dishes. He missed work Tuesday, but needs a note. He is planning. His pain was 10/10 Pain 4-5/10  Has been resting it Pain radiates to buttocks to back on R knee. Today only to bu   Past Medical History:  Diagnosis Date  . ADHD (attention deficit hyperactivity disorder)   . Anxiety   . Depression     There are no problems to display for this patient.   History reviewed. No pertinent surgical history.     Home Medications    Prior to Admission medications   Medication Sig Start Date End Date Taking? Authorizing Provider  chlorhexidine (PERIDEX) 0.12 % solution Use as directed 15 mLs in the mouth or throat 2 (two) times daily. 10/27/20   Wurst, [place], PA-C  meloxicam (MOBIC) 15 MG tablet Take 1 tablet (15 mg total) by mouth daily. 10/27/20   Rennis Harding, PA-C    Family History Family History  Problem Relation Age of Onset  . Diabetes Other     Social History Social History   Tobacco Use  . Smoking status: Current Every Day Smoker    Packs/day: 0.50    Years: 5.00    Pack years: 2.50    Types: Cigarettes  . Smokeless tobacco: Never Used  Substance Use Topics  . Alcohol use: Yes    Comment: occ  . Drug use: Not Currently    Types: Methamphetamines, IV, Cocaine, Marijuana    Comment: does not use drugs anymore      Allergies   Flexeril [cyclobenzaprine], Hydrocodone, and Tramadol   Review of Systems Review of Systems  Gastrointestinal: Negative for abdominal pain.        Denies loss of bowels   Genitourinary: Negative for difficulty urinating.  Musculoskeletal: Positive for back pain.  Neurological: Negative for numbness.     Physical Exam Triage Vital Signs ED Triage Vitals  Enc Vitals Group     BP 05/21/21 1600 121/87     Pulse Rate 05/21/21 1600 (!) 110     Resp --      Temp 05/21/21 1600 98.8 F (37.1 C)     Temp Source 05/21/21 1600 Oral     SpO2 05/21/21 1600 97 %     Weight 05/21/21 1559 140 lb (63.5 kg)     Height 05/21/21 1559 5' 10.5" (1.791 m)     Head Circumference --      Peak Flow --      Pain Score 05/21/21 1559 5     Pain Loc --      Pain Edu? --      Excl. in GC? --    No data found.  Updated Vital Signs BP 121/87 (BP Location: Right Arm)   Pulse (!) 110   Temp 98.8 F (37.1 C) (Oral)   Ht 5' 10.5" (1.791 m)   Wt 140 lb (63.5  kg)   SpO2 97%   BMI 19.80 kg/m   Visual Acuity Right Eye Distance:   Left Eye Distance:   Bilateral Distance:    Right Eye Near:   Left Eye Near:    Bilateral Near:     Physical Exam Vitals and nursing note reviewed.  HENT:     Head: Normocephalic.     Right Ear: External ear normal.     Left Ear: External ear normal.  Eyes:     General: No scleral icterus.    Conjunctiva/sclera: Conjunctivae normal.  Pulmonary:     Effort: Pulmonary effort is normal.  Musculoskeletal:        General: Normal range of motion.     Comments: BACK- has tense mid R lumbar spine muscles which are tender, worse pain provoked with lateral flexion to his L and anterior flexion > 60 degrees. + SLR  Skin:    General: Skin is warm and dry.     Findings: No rash.  Neurological:     Mental Status: He is alert and oriented to person, place, and time.     Gait: Gait normal.     Deep Tendon Reflexes: Reflexes normal.  Psychiatric:        Mood and Affect: Mood normal.        Behavior: Behavior normal.        Thought Content: Thought content normal.        Judgment: Judgment normal.      UC  Treatments / Results  Labs (all labs ordered are listed, but only abnormal results are displayed) Labs Reviewed - No data to display  EKG   Radiology No results found.  Procedures Procedures (including critical care time)  Medications Ordered in UC Medications - No data to display  Initial Impression / Assessment and Plan / UC Course  I have reviewed the triage vital signs and the nursing notes  Has R lumbar radiculopathy. I placed him on Baclofen and Ibuprofen as noted.   Final Clinical Impressions(s) / UC Diagnoses   Final diagnoses:  None   Discharge Instructions   None    ED Prescriptions    None     PDMP not reviewed this encounter.   Garey Ham, [place] 05/21/21 1629

## 2021-05-21 NOTE — Discharge Instructions (Signed)
Go to ER if you loose your urine and bowel or foot drop.

## 2021-05-21 NOTE — ED Triage Notes (Signed)
Pt states that he has a history of back pain.

## 2021-08-27 ENCOUNTER — Other Ambulatory Visit: Payer: Self-pay

## 2021-08-27 ENCOUNTER — Encounter: Payer: Self-pay | Admitting: Emergency Medicine

## 2021-08-27 ENCOUNTER — Ambulatory Visit
Admission: EM | Admit: 2021-08-27 | Discharge: 2021-08-27 | Disposition: A | Discharge status: Home / Self Care | Payer: Self-pay | Attending: Emergency Medicine | Admitting: Emergency Medicine

## 2021-08-27 DIAGNOSIS — K0889 Other specified disorders of teeth and supporting structures: Secondary | ICD-10-CM

## 2021-08-27 DIAGNOSIS — K047 Periapical abscess without sinus: Secondary | ICD-10-CM

## 2021-08-27 MED ORDER — CHLORHEXIDINE GLUCONATE 0.12 % MT SOLN: 15.0000 mL | Freq: Two times a day (BID) | OROMUCOSAL | 0 refills | Status: DC

## 2021-08-27 MED ORDER — MELOXICAM 15 MG PO TABS: 15.0000 mg | ORAL_TABLET | Freq: Every day | ORAL | 0 refills | Status: DC

## 2021-08-27 MED ORDER — AMOXICILLIN-POT CLAVULANATE 875-125 MG PO TABS: 1.0000 | ORAL_TABLET | Freq: Two times a day (BID) | ORAL | 0 refills | Status: DC

## 2021-08-27 NOTE — Discharge Instructions (Signed)
Mobic and peridex prescribed.  Use as directed for pain relief Augmentin prescribed Recommend soft diet until evaluated by dentist Maintain oral hygiene care Follow up with dentist as soon as possible for further evaluation and treatment  Return or go to the ED if you have any new or worsening symptoms such as fever, chills, difficulty swallowing, painful swallowing, oral or neck swelling, nausea, vomiting, chest pain, SOB, etc..Marland Kitchen

## 2021-08-27 NOTE — ED Triage Notes (Signed)
Dental pain on right side for a few weeks.

## 2021-08-27 NOTE — ED Provider Notes (Signed)
[facility]   [id number] 08/27/21 Arrival Time: 1820  CC: DENTAL PAIN  SUBJECTIVE:  Larry Wilson is a 30 y.o. male who presents with dental pain x few days.  Denies a precipitating event or trauma.  Localizes pain to top RT gums.  Has tried OTC analgesics without relief.  Worse with chewing.  Does not see a dentist regularly.  Reports similar symptoms in the past.  Denies fever, chills, dysphagia, odynophagia, oral or neck swelling, nausea, vomiting, chest pain, SOB.    ROS: As per HPI.  All other pertinent ROS negative.     Past Medical History:  Diagnosis Date   ADHD (attention deficit hyperactivity disorder)    Anxiety    Depression    History reviewed. No pertinent surgical history. Allergies  Allergen Reactions   Flexeril [Cyclobenzaprine] Other (See Comments)    Headaches, irritable, changes mood.   Hydrocodone Itching   Tramadol Other (See Comments)    Burning Sensation    No current facility-administered medications on file prior to encounter.   No current outpatient medications on file prior to encounter.   Social History   Socioeconomic History   Marital status: Single    Spouse name: Not on file   Number of children: Not on file   Years of education: Not on file   Highest education level: Not on file  Occupational History   Not on file  Tobacco Use   Smoking status: Every Day    Packs/day: 0.50    Years: 5.00    Pack years: 2.50    Types: Cigarettes   Smokeless tobacco: Never  Substance and Sexual Activity   Alcohol use: Yes    Comment: occ   Drug use: Not Currently    Types: Methamphetamines, IV, Cocaine, Marijuana    Comment: does not use drugs anymore    Sexual activity: Yes    Birth control/protection: None  Other Topics Concern   Not on file  Social History Narrative   Not on file   Social Determinants of Health   Financial Resource Strain: Not on file  Food Insecurity: Not on file  Transportation Needs: Not on file   Physical Activity: Not on file  Stress: Not on file  Social Connections: Not on file  Intimate Partner Violence: Not on file   Family History  Problem Relation Age of Onset   Diabetes Other     OBJECTIVE:  Vitals:   08/27/21 1827  BP: 140/86  Pulse: 85  Resp: 16  Temp: 98.5 F (36.9 C)  TempSrc: Oral  SpO2: 98%    General appearance: alert; no distress HENT: normocephalic; atraumatic; dentition: poor; dental caries RT upper gum Neck: supple without LAD Lungs: normal respirations Skin: warm and dry Psychological: alert and cooperative; normal mood and affect  ASSESSMENT & PLAN:  1. Pain, dental   2. Dental infection     Meds ordered this encounter  Medications   chlorhexidine (PERIDEX) 0.12 % solution    Sig: Use as directed 15 mLs in the mouth or throat 2 (two) times daily.    Dispense:  473 mL    Refill:  0    Order Specific Question:   Supervising Provider    Answer:   Eustace Moore [[id number]]   amoxicillin-clavulanate (AUGMENTIN) 875-125 MG tablet    Sig: Take 1 tablet by mouth every 12 (twelve) hours for 10 days.    Dispense:  20 tablet    Refill:  0  Order Specific Question:   Supervising Provider    Answer:   Eustace Moore [[id number]]   meloxicam (MOBIC) 15 MG tablet    Sig: Take 1 tablet (15 mg total) by mouth daily.    Dispense:  20 tablet    Refill:  0    Order Specific Question:   Supervising Provider    Answer:   Eustace Moore [[id number]]     Mobic and peridex prescribed.  Use as directed for pain relief Augmentin prescribed Recommend soft diet until evaluated by dentist Maintain oral hygiene care Follow up with dentist as soon as possible for further evaluation and treatment  Return or go to the ED if you have any new or worsening symptoms such as fever, chills, difficulty swallowing, painful swallowing, oral or neck swelling, nausea, vomiting, chest pain, SOB, etc...  Reviewed expectations re: course of current medical  issues. Questions answered. Outlined signs and symptoms indicating need for more acute intervention. Patient verbalized understanding. After Visit Summary given.    Rennis Harding, PA-C 08/27/21 [phone]

## 2021-08-28 ENCOUNTER — Telehealth: Payer: Self-pay | Admitting: Emergency Medicine

## 2021-08-28 MED ORDER — AMOXICILLIN-POT CLAVULANATE 875-125 MG PO TABS: 1.0000 | ORAL_TABLET | Freq: Two times a day (BID) | ORAL | 0 refills | Status: AC

## 2021-08-28 MED ORDER — MELOXICAM 15 MG PO TABS: 15.0000 mg | ORAL_TABLET | Freq: Every day | ORAL | 0 refills | Status: DC

## 2021-08-28 MED ORDER — CHLORHEXIDINE GLUCONATE 0.12 % MT SOLN: 15.0000 mL | Freq: Two times a day (BID) | OROMUCOSAL | 0 refills | Status: DC

## 2021-08-28 NOTE — Telephone Encounter (Signed)
Request medications to be sent to another pharmacy

## 2021-11-15 ENCOUNTER — Emergency Department (HOSPITAL_COMMUNITY)
Admission: EM | Admit: 2021-11-15 | Discharge: 2021-11-15 | Disposition: A | Discharge status: Home / Self Care | Payer: Self-pay | Attending: Emergency Medicine | Admitting: Emergency Medicine

## 2021-11-15 ENCOUNTER — Other Ambulatory Visit: Payer: Self-pay

## 2021-11-15 ENCOUNTER — Encounter (HOSPITAL_COMMUNITY): Payer: Self-pay | Admitting: Emergency Medicine

## 2021-11-15 DIAGNOSIS — K0889 Other specified disorders of teeth and supporting structures: Secondary | ICD-10-CM

## 2021-11-15 DIAGNOSIS — F17210 Nicotine dependence, cigarettes, uncomplicated: Secondary | ICD-10-CM | POA: Insufficient documentation

## 2021-11-15 DIAGNOSIS — K029 Dental caries, unspecified: Secondary | ICD-10-CM | POA: Insufficient documentation

## 2021-11-15 MED ORDER — AMOXICILLIN-POT CLAVULANATE 875-125 MG PO TABS
1.0000 | ORAL_TABLET | Freq: Once | ORAL | Status: AC
  Administered 2021-11-15: 04:00:00 1 via ORAL
  Filled 2021-11-15: qty 1

## 2021-11-15 MED ORDER — AMOXICILLIN-POT CLAVULANATE 875-125 MG PO TABS: 1.0000 | ORAL_TABLET | Freq: Two times a day (BID) | ORAL | 0 refills | Status: DC

## 2021-11-15 NOTE — ED Provider Notes (Signed)
[facility] EMERGENCY DEPARTMENT Provider Note   CSN: [id number] Arrival date & time: 11/15/21  0235     History Chief Complaint  Patient presents with   Dental Pain    Larry Wilson is a 30 y.o. male.  Patient presents to the emergency department for evaluation of tooth ache.  Patient has been using Tylenol and Motrin without improvement.  Patient reports that he has a large cavity on the right upper side of his mouth that is causing pain.  No fever or facial swelling.   Dental Pain Associated symptoms: no fever       Past Medical History:  Diagnosis Date   ADHD (attention deficit hyperactivity disorder)    Anxiety    Depression     There are no problems to display for this patient.   History reviewed. No pertinent surgical history.     Family History  Problem Relation Age of Onset   Diabetes Other     Social History   Tobacco Use   Smoking status: Every Day    Packs/day: 0.50    Years: 5.00    Pack years: 2.50    Types: Cigarettes   Smokeless tobacco: Never  Substance Use Topics   Alcohol use: Yes    Comment: occ   Drug use: Not Currently    Types: Methamphetamines, IV, Cocaine, Marijuana    Comment: does not use drugs anymore     Home Medications Prior to Admission medications   Medication Sig Start Date End Date Taking? Authorizing Provider  amoxicillin-clavulanate (AUGMENTIN) 875-125 MG tablet Take 1 tablet by mouth 2 (two) times daily. 11/15/21  Yes Terence Googe, Canary Brim, MD  chlorhexidine (PERIDEX) 0.12 % solution Use as directed 15 mLs in the mouth or throat 2 (two) times daily. 08/28/21   Wurst, [place], PA-C  meloxicam (MOBIC) 15 MG tablet Take 1 tablet (15 mg total) by mouth daily. 08/28/21   Wurst, [place], PA-C    Allergies    Flexeril [cyclobenzaprine], Hydrocodone, and Tramadol  Review of Systems   Review of Systems  Constitutional:  Negative for fever.  HENT:  Positive for dental problem.    Physical Exam Updated Vital  Signs BP 135/88 (BP Location: Right Arm)   Pulse 93   Temp 98 F (36.7 C) (Oral)   Resp 18   Ht 5' 10.5" (1.791 m)   Wt 67.8 kg   SpO2 100%   BMI 21.15 kg/m   Physical Exam Constitutional:      General: He is in acute distress.  HENT:     Mouth/Throat:     Dentition: Abnormal dentition. Dental tenderness and dental caries present. No dental abscesses.   Cardiovascular:     Rate and Rhythm: Normal rate.     Heart sounds: Normal heart sounds.  Pulmonary:     Breath sounds: Normal breath sounds.  Lymphadenopathy:     Cervical: No cervical adenopathy.  Skin:    General: Skin is warm.     Findings: No erythema.    ED Results / Procedures / Treatments   Labs (all labs ordered are listed, but only abnormal results are displayed) Labs Reviewed - No data to display  EKG None  Radiology No results found.  Procedures Procedures   Medications Ordered in ED Medications  amoxicillin-clavulanate (AUGMENTIN) 875-125 MG per tablet 1 tablet (1 tablet Oral Given 11/15/21 5053)    ED Course  I have reviewed the triage vital signs and the nursing notes.  Pertinent labs &  imaging results that were available during my care of the patient were reviewed by me and considered in my medical decision making (see chart for details).    MDM Rules/Calculators/A&P                           Patient presents to the emergency department for evaluation of dental pain.  Patient with multiple caries noted on exam, patient with tenderness and pain around the upper right most posterior molar  Final Clinical Impression(s) / ED Diagnoses Final diagnoses:  Pain, dental  Dental caries    Rx / DC Orders ED Discharge Orders          Ordered    amoxicillin-clavulanate (AUGMENTIN) 875-125 MG tablet  2 times daily        11/15/21 0333             Gilda Crease, MD 11/15/21 [phone]

## 2021-11-15 NOTE — ED Triage Notes (Signed)
Pt with c/o Right upper dental pain x 3 days. States this is an "ongoing" issue.

## 2022-05-19 DIAGNOSIS — F1521 Other stimulant dependence, in remission: Secondary | ICD-10-CM | POA: Insufficient documentation

## 2022-05-19 DIAGNOSIS — F419 Anxiety disorder, unspecified: Secondary | ICD-10-CM | POA: Insufficient documentation

## 2022-09-15 ENCOUNTER — Telehealth: Payer: Self-pay

## 2022-09-15 NOTE — Telephone Encounter (Signed)
Larry Wilson from [facility] returned call, appointment for client provided of 09/20/22 at [place].  They mentioned that they would like to add the client to the dental wait list. Discussed that the client would need to discuss that need at his appointment and the provider would make the referral for dental back to [facility] and he would be added to the dental wait list, only the provider can determine if need is urgent or non-urgent.    Debria Garret RN Clara Gunn/[facility]

## 2022-09-15 NOTE — Telephone Encounter (Signed)
Called [facility] to provide appointment information for new client of [facility]. No answer left message for staff to return call.  Appointment to establish medical care with [facility] is set for 09/20/22 at [place]

## 2022-09-20 ENCOUNTER — Ambulatory Visit: Payer: Self-pay | Admitting: Physician Assistant

## 2022-09-20 ENCOUNTER — Encounter: Payer: Self-pay | Admitting: Physician Assistant

## 2022-09-20 VITALS — BP 130/83 | HR 97 | Temp 98.0°F | Wt 147.0 lb

## 2022-09-20 DIAGNOSIS — F17200 Nicotine dependence, unspecified, uncomplicated: Secondary | ICD-10-CM

## 2022-09-20 DIAGNOSIS — F1911 Other psychoactive substance abuse, in remission: Secondary | ICD-10-CM

## 2022-09-20 DIAGNOSIS — Z1159 Encounter for screening for other viral diseases: Secondary | ICD-10-CM

## 2022-09-20 DIAGNOSIS — Z13220 Encounter for screening for lipoid disorders: Secondary | ICD-10-CM

## 2022-09-20 DIAGNOSIS — Z7689 Persons encountering health services in other specified circumstances: Secondary | ICD-10-CM

## 2022-09-20 NOTE — Progress Notes (Signed)
BP 130/83   Pulse 97   Temp 98 F (36.7 C)   Wt 147 lb (66.7 kg)   SpO2 97%   BMI 20.79 kg/m    Subjective:    Patient ID: Larry Wilson, male    DOB: 1991/10/30, 31 y.o.   MRN: [id number]  HPI: Larry Wilson is a 31 y.o. male presenting on 09/20/2022 for New Patient (Initial Visit)   HPI    Chief Complaint  Patient presents with   New Patient (Initial Visit)     Pt is 31yoM who presents to establish care.  He Moved into [facility] about a week ago.  He says he is doing well there.   He is  native.   He goes to [facility] [employer] .  The psychiatrist there has prescribed him seroquel.      He would like information on smoking cessation  His only other issue is the ADD.  He says he Can't read or retain due to the ADD.     Pt has used IVDU in past and hasn't been tested for hepatitis recently.    Pt has not yet received covid vaccination    Relevant past medical, surgical, family and social history reviewed and updated as indicated. Interim medical history since our last visit reviewed. Allergies and medications reviewed and updated.    Current Outpatient Medications:    QUEtiapine (SEROQUEL) 100 MG tablet, Take 100 mg by mouth at bedtime., Disp: , Rfl:    Review of Systems  Per HPI unless specifically indicated above     Objective:    BP 130/83   Pulse 97   Temp 98 F (36.7 C)   Wt 147 lb (66.7 kg)   SpO2 97%   BMI 20.79 kg/m   Wt Readings from Last 3 Encounters:  09/20/22 147 lb (66.7 kg)  11/15/21 149 lb 8 oz (67.8 kg)  05/21/21 140 lb (63.5 kg)    Physical Exam Vitals reviewed.  Constitutional:      General: He is not in acute distress.    Appearance: He is well-developed. He is not toxic-appearing.  HENT:     Head: Normocephalic and atraumatic.     Right Ear: Tympanic membrane, ear canal and external ear normal.     Left Ear: Tympanic membrane, ear canal and external ear normal.  Eyes:     Extraocular Movements: Extraocular movements  intact.     Conjunctiva/sclera: Conjunctivae normal.     Pupils: Pupils are equal, round, and reactive to light.  Neck:     Thyroid: No thyromegaly.  Cardiovascular:     Rate and Rhythm: Normal rate and regular rhythm.  Pulmonary:     Effort: Pulmonary effort is normal.     Breath sounds: Normal breath sounds. No wheezing or rales.  Abdominal:     General: Bowel sounds are normal.     Palpations: Abdomen is soft. There is no mass.     Tenderness: There is no abdominal tenderness.  Musculoskeletal:     Cervical back: Neck supple.     Right lower leg: No edema.     Left lower leg: No edema.  Lymphadenopathy:     Cervical: No cervical adenopathy.  Skin:    General: Skin is warm and dry.     Findings: No rash.  Neurological:     Mental Status: He is alert and oriented to person, place, and time.     Motor: No weakness or tremor.  Gait: Gait is intact. Gait normal.     Deep Tendon Reflexes:     Reflex Scores:      Patellar reflexes are 2+ on the right side and 2+ on the left side. Psychiatric:        Attention and Perception: Attention normal.        Speech: Speech normal.        Behavior: Behavior normal. Behavior is cooperative.     Comments: Anxious/hyper/fidgety. Engaged in conversation             Assessment & Plan:    Encounter Diagnoses  Name Primary?   Encounter to establish care Yes   Substance abuse in remission ([place])    Tobacco use disorder    Need for hepatitis C screening test    Screening cholesterol level       -pt to continue with [facility] -will Screen hepatitis and lipids.  Offered appointment to review labs but pt says he is fine with results called to him. -pt is put on dental list.  -pt is encouraged to get covid vaccination -counseled on smoking cessation and gave him resources/handouts.  Discussed with pt that he needs to keep the substance abstinence as top priority and tobacco abstinence as secondary .  He states understanding -pt is  scheduled for follow up one year.  He is to contact office sooner prn

## 2022-09-21 ENCOUNTER — Other Ambulatory Visit (HOSPITAL_COMMUNITY)
Admission: RE | Admit: 2022-09-21 | Discharge: 2022-09-21 | Disposition: A | Discharge status: Home / Self Care | Payer: Self-pay | Source: Ambulatory Visit | Attending: Physician Assistant | Admitting: Physician Assistant

## 2022-09-21 DIAGNOSIS — F1911 Other psychoactive substance abuse, in remission: Secondary | ICD-10-CM | POA: Insufficient documentation

## 2022-09-21 DIAGNOSIS — Z13220 Encounter for screening for lipoid disorders: Secondary | ICD-10-CM | POA: Insufficient documentation

## 2022-09-21 DIAGNOSIS — Z1159 Encounter for screening for other viral diseases: Secondary | ICD-10-CM | POA: Insufficient documentation

## 2022-09-21 LAB — HEPATITIS PANEL, ACUTE
HCV Ab: REACTIVE — AB
Hep A IgM: NONREACTIVE
Hep B C IgM: NONREACTIVE
Hepatitis B Surface Ag: NONREACTIVE

## 2022-09-21 LAB — COMPREHENSIVE METABOLIC PANEL
ALT: 57 U/L — ABNORMAL HIGH (ref 0–44)
AST: 57 U/L — ABNORMAL HIGH (ref 15–41)
Albumin: 3.9 g/dL (ref 3.5–5.0)
Alkaline Phosphatase: 68 U/L (ref 38–126)
Anion gap: 7 (ref 5–15)
BUN: 18 mg/dL (ref 6–20)
CO2: 25 mmol/L (ref 22–32)
Calcium: 8.9 mg/dL (ref 8.9–10.3)
Chloride: 108 mmol/L (ref 98–111)
Creatinine, Ser: 1.04 mg/dL (ref 0.61–1.24)
GFR, Estimated: >60 mL/min (ref >60)
Glucose, Bld: 89 mg/dL (ref 70–99)
Potassium: 3.9 mmol/L (ref 3.5–5.1)
Sodium: 140 mmol/L (ref 135–145)
Total Bilirubin: 0.5 mg/dL (ref 0.3–1.2)
Total Protein: 7.3 g/dL (ref 6.5–8.1)

## 2022-09-21 LAB — LIPID PANEL
Cholesterol: 183 mg/dL (ref 0–200)
HDL: 49 mg/dL (ref >40)
LDL Cholesterol: 125 mg/dL — ABNORMAL HIGH (ref 0–99)
Total CHOL/HDL Ratio: 3.7 RATIO
Triglycerides: 47 mg/dL (ref <150)
VLDL: 9 mg/dL (ref 0–40)

## 2022-10-05 ENCOUNTER — Ambulatory Visit: Payer: Self-pay | Admitting: Physician Assistant

## 2022-10-05 ENCOUNTER — Encounter: Payer: Self-pay | Admitting: Physician Assistant

## 2022-10-05 VITALS — BP 118/82 | HR 109 | Temp 98.2°F | Wt 152.5 lb

## 2022-10-05 DIAGNOSIS — K6289 Other specified diseases of anus and rectum: Secondary | ICD-10-CM

## 2022-10-05 DIAGNOSIS — B1920 Unspecified viral hepatitis C without hepatic coma: Secondary | ICD-10-CM

## 2022-10-05 DIAGNOSIS — F1911 Other psychoactive substance abuse, in remission: Secondary | ICD-10-CM | POA: Insufficient documentation

## 2022-10-05 DIAGNOSIS — R7989 Other specified abnormal findings of blood chemistry: Secondary | ICD-10-CM

## 2022-10-05 NOTE — Progress Notes (Signed)
BP 118/82   Pulse (!) 109   Temp 98.2 F (36.8 C)   Wt 152 lb 8 oz (69.2 kg)   SpO2 97%   BMI 21.57 kg/m    Subjective:    Patient ID: Larry Wilson, male    DOB: 10-08-1991, 31 y.o.   MRN: [id number]  HPI: Larry Wilson is a 31 y.o. male presenting on 10/05/2022 for Rectal Pain (Anal burning and pain when wiping and burning and pain sitting or lying in bed. Pt had bleeding in the past but not recently. Pt noticed that when he is using drugs he bleeds, pt as been inn recovery for the past 30 days and has not had bleeding. This has been ongoing for about a year.)   HPI  Chief Complaint  Patient presents with   Rectal Pain    Anal burning and pain when wiping and burning and pain sitting or lying in bed. Pt had bleeding in the past but not recently. Pt noticed that when he is using drugs he bleeds, pt as been inn recovery for the past 30 days and has not had bleeding. This has been ongoing for about a year.    Rectal pain as above.  Pt is heterosexual and says he has not had anything go in his rectum.  He denies constipation and says his stool is not hard.  Pt is still at [facility] house and is doing well with that.    Relevant past medical, surgical, family and social history reviewed and updated as indicated. Interim medical history since our last visit reviewed. Allergies and medications reviewed and updated.    Current Outpatient Medications:    QUEtiapine (SEROQUEL) 100 MG tablet, Take 100 mg by mouth at bedtime., Disp: , Rfl:    Review of Systems  Per HPI unless specifically indicated above     Objective:    BP 118/82   Pulse (!) 109   Temp 98.2 F (36.8 C)   Wt 152 lb 8 oz (69.2 kg)   SpO2 97%   BMI 21.57 kg/m   Wt Readings from Last 3 Encounters:  10/05/22 152 lb 8 oz (69.2 kg)  09/20/22 147 lb (66.7 kg)  11/15/21 149 lb 8 oz (67.8 kg)    Physical Exam Exam conducted with a chaperone present (nurse Berenice assisted).  Constitutional:       General: He is not in acute distress.    Appearance: He is normal weight. He is not ill-appearing.  HENT:     Head: Normocephalic and atraumatic.  Pulmonary:     Effort: Pulmonary effort is normal. No respiratory distress.  Abdominal:     General: Bowel sounds are normal.     Palpations: Abdomen is soft. There is no mass.     Tenderness: There is no abdominal tenderness. There is no guarding or rebound.  Genitourinary:    Rectum: Tenderness present. No mass or external hemorrhoid. Normal anal tone.  Skin:    General: Skin is warm and dry.  Neurological:     Mental Status: He is alert and oriented to person, place, and time.  Psychiatric:        Behavior: Behavior normal.     Results for orders placed or performed during the hospital encounter of 09/21/22  Hepatitis, Acute  Result Value Ref Range   Hepatitis B Surface Ag NON REACTIVE NON REACTIVE   HCV Ab Reactive (A) NON REACTIVE   Hep A IgM NON REACTIVE NON REACTIVE  Hep B C IgM NON REACTIVE NON REACTIVE  Lipid panel  Result Value Ref Range   Cholesterol 183 0 - 200 mg/dL   Triglycerides 47 <150 mg/dL   HDL 49 >40 mg/dL   Total CHOL/HDL Ratio 3.7 RATIO   VLDL 9 0 - 40 mg/dL   LDL Cholesterol 125 (H) 0 - 99 mg/dL  Comprehensive metabolic panel  Result Value Ref Range   Sodium 140 135 - 145 mmol/L   Potassium 3.9 3.5 - 5.1 mmol/L   Chloride 108 98 - 111 mmol/L   CO2 25 22 - 32 mmol/L   Glucose, Bld 89 70 - 99 mg/dL   BUN 18 6 - 20 mg/dL   Creatinine, Ser 1.04 0.61 - 1.24 mg/dL   Calcium 8.9 8.9 - 10.3 mg/dL   Total Protein 7.3 6.5 - 8.1 g/dL   Albumin 3.9 3.5 - 5.0 g/dL   AST 57 (H) 15 - 41 U/L   ALT 57 (H) 0 - 44 U/L   Alkaline Phosphatase 68 38 - 126 U/L   Total Bilirubin 0.5 0.3 - 1.2 mg/dL   GFR, Estimated >60 >60 mL/min   Anion gap 7 5 - 15      Assessment & Plan:    Encounter Diagnoses  Name Primary?   Hepatitis C virus infection without hepatic coma, unspecified chronicity Yes   Rectal pain     Substance abuse in remission ([place])    Elevated LFTs      -reviewed labs with pt   Rectal pain -likely internal hemorrhoids -recommend Stool softner and plenty of water -OTC suppositories like preparation H -wipes like Tucks -counseled to avoid straining -will refer to GI for further evaluation  Hepatitis C -Refer to GI  Uninsured -pt was given [facility]/application for [employer] financial assistance  Pt to follow up as scheduled.  He is to contact office sooner prn

## 2022-10-06 ENCOUNTER — Encounter: Payer: Self-pay | Admitting: *Deleted

## 2022-10-14 ENCOUNTER — Encounter: Payer: Self-pay | Admitting: Physician Assistant

## 2022-10-14 ENCOUNTER — Ambulatory Visit: Payer: Self-pay | Admitting: Physician Assistant

## 2022-10-14 VITALS — BP 132/78 | HR 109 | Temp 97.1°F

## 2022-10-14 DIAGNOSIS — K6289 Other specified diseases of anus and rectum: Secondary | ICD-10-CM

## 2022-10-14 MED ORDER — EPIFOAM 1-1 % EX FOAM: Freq: Three times a day (TID) | CUTANEOUS | 0 refills | Status: DC | PRN

## 2022-10-14 NOTE — Progress Notes (Signed)
   BP 132/78   Pulse (!) 109   Temp (!) 97.1 F (36.2 C)   SpO2 98%    Subjective:    Patient ID: Larry Wilson, male    DOB: 1991/03/03, 31 y.o.   MRN: [id number]  HPI: Larry Wilson is a 31 y.o. male presenting on 10/14/2022 for No chief complaint on file.   HPI  Pt in today with increased peri-rectal pain.  He says the pain is more constant.  He has had no bleeding.  He says the suppositories he tried OTC increased the burning.  He is taking the stool softeners and is using Tucks pads.  He says he is not having any itching, just burning.   He has appt with GI for this on 11/02/22.  He says he has submitted his application for [employer] financial assistance   Relevant past medical, surgical, family and social history reviewed and updated as indicated. Interim medical history since our last visit reviewed. Allergies and medications reviewed and updated.   Current Outpatient Medications:    Docusate Sodium (STOOL SOFTENER LAXATIVE PO), Take by mouth., Disp: , Rfl:    QUEtiapine (SEROQUEL) 100 MG tablet, Take 100 mg by mouth at bedtime., Disp: , Rfl:     Review of Systems  Per HPI unless specifically indicated above     Objective:    BP 132/78   Pulse (!) 109   Temp (!) 97.1 F (36.2 C)   SpO2 98%   Wt Readings from Last 3 Encounters:  10/05/22 152 lb 8 oz (69.2 kg)  09/20/22 147 lb (66.7 kg)  11/15/21 149 lb 8 oz (67.8 kg)    Physical Exam Exam conducted with a chaperone present.  Constitutional:      General: He is not in acute distress.    Appearance: He is not ill-appearing.  HENT:     Head: Normocephalic and atraumatic.  Pulmonary:     Effort: Pulmonary effort is normal. No respiratory distress.  Genitourinary:    Rectum: Tenderness present. No external hemorrhoid.     Comments: (CV chaperoned) Tenderness just anterior to anus.  No redness or mass palpable.  No fissures seen.  Neurological:     Mental Status: He is alert and oriented to person,  place, and time.  Psychiatric:        Behavior: Behavior normal.           Assessment & Plan:   Encounter Diagnosis  Name Primary?   Rectal pain Yes      -pt to continue stool softeners and drinking plenty of water -he is given sample epifoam to use to help with the burning until he gets seen by GI -pt to follow up as scheduled

## 2022-11-01 NOTE — Progress Notes (Signed)
GI Office Note    Referring Provider: Jacquelin Hawking, PA-C Primary Care Physician:  Jacquelin Hawking, PA-C  Primary Gastroenterologist: Hennie Duos. Marletta Lor, DO   Chief Complaint   Chief Complaint  Patient presents with   Hepatitis C    Here for treatment.   Rectal Pain    Thinks he may have a hemorrhoid, no issues with bm's.     History of Present Illness   Larry Wilson is a 31 y.o. male presenting today at the request of Jacquelin Hawking PA-C for further management of Hepatitis C and rectal pain. Currently resides at [facility] for substance abuse rehab.  Positive HCV Ab 09/21/22. UDS 04/2022: positive for amphetamine, marijuana, d-methamphetamine.  Patient states he first was told he had hepatitis C after donating blood about 5 to 6 years ago.  States that he went for treatment but he was advised to come back in 3 months to see if he was able to clear the virus on his own and was told that he did.  He continued to use drugs and states he was told again later he had hepatitis C again.  The longest he has been clean from all substances was 4 months.  Currently clean since September 07, 2022.  Patient denies heartburn, abdominal pain.  Appetite is good.  He complains of rectal pain has been off and on for 1 year.  At first he noted it while he was using drugs.  He developed several abscessed teeth and was not able to afford antibiotics.  He used a lot of ibuprofen which resulted in significant blood per rectum.  Used to pass dark red blood in large quantities.  If he cut down on drug use this would resolve.  He has been having pain persistently for several months.  After having a bowel movement he develops burning in the anorectal area.  Denies straining to have a stool.  Denies hard stools.  Typically has 1-2 stools per day.  For bowel movement if he has to sit he has a lot of burning.  If he is able to walk around he does not have as much discomfort.  Hurts to wipe.  Saw a tiny bit of  blood last week. DRE by PCP showed tenderness just anterior to anus. Patient states he has had no rectal penetration. He tried suppositories but burning worse. Has had been using hemorrhoid cream externally.     Medications   Current Outpatient Medications  Medication Sig Dispense Refill   IBUPROFEN PO Take 500 mg by mouth every 6 (six) hours as needed (for pain).     QUEtiapine (SEROQUEL) 100 MG tablet Take 100 mg by mouth at bedtime.     shark liver oil-cocoa butter (PREPARATION H) 0.25-3-85.5 % suppository Place 1 suppository rectally as needed for hemorrhoids.     Docusate Sodium (STOOL SOFTENER LAXATIVE PO) Take by mouth. (Patient not taking: Reported on 11/02/2022)     pramoxine-hydrocortisone (EPIFOAM) 1-1 % foam Apply topically 3 (three) times daily as needed. (Patient not taking: Reported on 11/02/2022) 10 g 0   No current facility-administered medications for this visit.    Allergies   Allergies as of 11/02/2022 - Review Complete 11/02/2022  Allergen Reaction Noted   Flexeril [cyclobenzaprine] Other (See Comments) 04/22/2012   Hydrocodone Itching 04/17/2012   Tramadol Other (See Comments) 11/26/2013    Past Medical History   Past Medical History:  Diagnosis Date   ADHD (attention deficit hyperactivity disorder)  Anxiety    Depression    Polysubstance abuse (HCC)    last use 09/07/22    Past Surgical History   Past Surgical History:  Procedure Laterality Date   none      Past Family History   Family History  Problem Relation Age of Onset   Heart disease Mother    Drug abuse Mother    Diabetes Other    Colon cancer Neg Hx    Inflammatory bowel disease Neg Hx    Liver disease Neg Hx     Past Social History   Social History   Socioeconomic History   Marital status: Single    Spouse name: Not on file   Number of children: Not on file   Years of education: Not on file   Highest education level: Not on file  Occupational History   Not on file   Tobacco Use   Smoking status: Every Day    Packs/day: 0.50    Years: 5.00    Total pack years: 2.50    Types: Cigarettes   Smokeless tobacco: Never  Vaping Use   Vaping Use: Every day  Substance and Sexual Activity   Alcohol use: Not Currently    Comment: occ   Drug use: Not Currently    Types: Methamphetamines, IV, Cocaine, Marijuana, Heroin    Comment: last use 09/07/22   Sexual activity: Yes    Birth control/protection: None  Other Topics Concern   Not on file  Social History Narrative   Not on file   Social Determinants of Health   Financial Resource Strain: Not on file  Food Insecurity: Not on file  Transportation Needs: Not on file  Physical Activity: Not on file  Stress: Not on file  Social Connections: Not on file  Intimate Partner Violence: Not on file    Review of Systems   General: Negative for anorexia, weight loss, fever, chills, fatigue, weakness. Eyes: Negative for vision changes.  ENT: Negative for hoarseness, difficulty swallowing , nasal congestion. CV: Negative for chest pain, angina, palpitations, dyspnea on exertion, peripheral edema.  Respiratory: Negative for dyspnea at rest, dyspnea on exertion, cough, sputum, wheezing.  GI: See history of present illness. GU:  Negative for dysuria, hematuria, urinary incontinence, urinary frequency, nocturnal urination.  MS: Negative for joint pain, low back pain.  Derm: Negative for rash or itching.  Neuro: Negative for weakness, abnormal sensation, seizure, frequent headaches, memory loss,  confusion.  Psych: Negative for anxiety, depression, suicidal ideation, hallucinations.  Endo: Negative for unusual weight change.  Heme: Negative for bruising or bleeding. Allergy: Negative for rash or hives.  Physical Exam   BP 115/84 (BP Location: Right Arm, Patient Position: Sitting, Cuff Size: Normal)   Pulse 92   Temp 98.1 F (36.7 C) (Oral)   Ht 5' 10.5" (1.791 m)   Wt 160 lb 3.2 oz (72.7 kg)   SpO2 98%    BMI 22.66 kg/m    General: Well-nourished, well-developed in no acute distress.  Head: Normocephalic, atraumatic.   Eyes: Conjunctiva pink, no icterus. Mouth: Oropharyngeal mucosa moist and pink , no lesions erythema or exudate. Neck: Supple without thyromegaly, masses, or lymphadenopathy.  Lungs: Clear to auscultation bilaterally.  Heart: Regular rate and rhythm, no murmurs rubs or gallops.  Abdomen: Bowel sounds are normal, nontender, nondistended, no hepatosplenomegaly or masses,  no abdominal bruits or hernia, no rebound or guarding.   Rectal: with palpation externally at the anal opening at [facility] he has tenderness. Nothing  seen externally. Just inside anal canal at the left lateral position he has tenderness. No tenderness with palpation of prostate. Nothing on examination glove.  Extremities: No lower extremity edema. No clubbing or deformities.  Neuro: Alert and oriented x 4 , grossly normal neurologically.  Skin: Warm and dry, no rash or jaundice.   Psych: Alert and cooperative, normal mood and affect.  Labs   Lab Results  Component Value Date   CREATININE 1.04 09/21/2022   BUN 18 09/21/2022   NA 140 09/21/2022   K 3.9 09/21/2022   CL 108 09/21/2022   CO2 25 09/21/2022   Lab Results  Component Value Date   ALT 57 (H) 09/21/2022   AST 57 (H) 09/21/2022   ALKPHOS 68 09/21/2022   BILITOT 0.5 09/21/2022   Lab Results  Component Value Date   WBC 9.3 09/26/2018   HGB 15.3 09/26/2018   HCT 46.1 09/26/2018   MCV 87.3 09/26/2018   PLT 225 09/26/2018   No results found for: "LIPASE"  Imaging Studies   No results found.  Assessment   Positive HCV Ab: suspect chronic HCV. AST/ALT elevated. Risk factors IV drug use. Patient currently clean and desires HCV treatment. We discussed modes of transmission, natural history, treatment options. Will start process of getting approval for HCV tx.   Rectal pain: likely hemorrhoid related, cannot exclude fissure as well.     PLAN   Labs, U/S with elastography. Avoid illicit drugs and etoh.  Use OTC hemorrhoid lidocaine mixed with epifoam anorectally per package label, instructions.    Leanna Battles. Melvyn Neth, MHS, PA-C [facility]

## 2022-11-02 ENCOUNTER — Telehealth: Payer: Self-pay | Admitting: *Deleted

## 2022-11-02 ENCOUNTER — Ambulatory Visit (INDEPENDENT_AMBULATORY_CARE_PROVIDER_SITE_OTHER): Payer: Self-pay | Admitting: Gastroenterology

## 2022-11-02 ENCOUNTER — Encounter: Payer: Self-pay | Admitting: Gastroenterology

## 2022-11-02 ENCOUNTER — Other Ambulatory Visit (HOSPITAL_COMMUNITY)
Admission: RE | Admit: 2022-11-02 | Discharge: 2022-11-02 | Disposition: A | Discharge status: Home / Self Care | Payer: Self-pay | Source: Ambulatory Visit | Attending: Gastroenterology | Admitting: Gastroenterology

## 2022-11-02 VITALS — BP 115/84 | HR 92 | Temp 98.1°F | Ht 70.5 in | Wt 160.2 lb

## 2022-11-02 DIAGNOSIS — R768 Other specified abnormal immunological findings in serum: Secondary | ICD-10-CM

## 2022-11-02 DIAGNOSIS — K6289 Other specified diseases of anus and rectum: Secondary | ICD-10-CM | POA: Insufficient documentation

## 2022-11-02 LAB — CBC WITH DIFFERENTIAL/PLATELET
Abs Immature Granulocytes: 0.04 10*3/uL (ref 0.00–0.07)
Basophils Absolute: 0.1 10*3/uL (ref 0.0–0.1)
Basophils Relative: 1 %
Eosinophils Absolute: 0.2 10*3/uL (ref 0.0–0.5)
Eosinophils Relative: 3 %
HCT: 49 % (ref 39.0–52.0)
Hemoglobin: 16.5 g/dL (ref 13.0–17.0)
Immature Granulocytes: 1 %
Lymphocytes Relative: 27 %
Lymphs Abs: 1.8 10*3/uL (ref 0.7–4.0)
MCH: 29.9 pg (ref 26.0–34.0)
MCHC: 33.7 g/dL (ref 30.0–36.0)
MCV: 88.8 fL (ref 80.0–100.0)
Monocytes Absolute: 0.5 10*3/uL (ref 0.1–1.0)
Monocytes Relative: 7 %
Neutro Abs: 4.3 10*3/uL (ref 1.7–7.7)
Neutrophils Relative %: 61 %
Platelets: 194 10*3/uL (ref 150–400)
RBC: 5.52 MIL/uL (ref 4.22–5.81)
RDW: 13.7 % (ref 11.5–15.5)
WBC: 6.9 10*3/uL (ref 4.0–10.5)
nRBC: 0 % (ref 0.0–0.2)

## 2022-11-02 LAB — HEPATITIS B SURFACE ANTIGEN: Hepatitis B Surface Ag: NONREACTIVE

## 2022-11-02 LAB — HIV ANTIBODY (ROUTINE TESTING W REFLEX): HIV Screen 4th Generation wRfx: NONREACTIVE

## 2022-11-02 LAB — PROTIME-INR
INR: 0.9 (ref 0.8–1.2)
Prothrombin Time: 12.5 s (ref 11.4–15.2)

## 2022-11-02 LAB — HEPATITIS B CORE ANTIBODY, TOTAL: Hep B Core Total Ab: NONREACTIVE

## 2022-11-02 NOTE — Telephone Encounter (Signed)
Pt informed that [place] is scheduled for 111/24/23 Friday at 9:30 am, arrive at 9:15 am, nothing to eat or drink after midnight.

## 2022-11-02 NOTE — Patient Instructions (Addendum)
Please have your labs and ultrasound done. We will contact you with results as available.  You will need to let us know if any of your medications change while on Hep C treatment as many can interfere with Hep C medication.  Continue to avoid illicit drugs and alcohol.  Use barrier protection (condoms) with sexual intercourse as you can infect other people until you are cured.  Buy over the counter hemorrhoid lidocaine cream and use AS NEEDED per package instructions for rectal pain. Use epifoam just inside the rectum AS NEEDED.    Hepatitis C Hepatitis C is a liver infection that is caused by the hepatitis C virus (HCV). The virus infects and causes inflammation in the liver. Hepatitis C can lead to: Loss of liver function (liver failure). Scarring of the liver (cirrhosis). Liver cancer. People with hepatitis C often do not know for months or years that they have this condition. This is because they have no symptoms or may have only mild symptoms. What are the causes? This condition is caused by HCV. The virus can spread from person to person (is contagious). It can spread through: Contact with an infected person's blood, semen, or vaginal fluids. Childbirth. A woman who has hepatitis C can pass it to her baby during birth. Having received donated blood (blood transfusion) or an organ transplant that was done in the [place] before 1992. What increases the risk? The following factors may make you more likely to develop this condition: Having contact with needles or syringes that have HCV on them (are contaminated). This may happen while injecting drugs, getting a tattoo or body piercing, or receiving acupuncture. Acupuncture is a treatment that inserts thin needles through your skin. Contact also may happen when you: Have sex with someone who is infected. The virus can spread through vaginal, oral, or anal sex. Receive treatment to filter your blood (kidney dialysis). Have a job that  involves contact with blood or body fluids, such as in health care. Having HIV (human immunodeficiency virus) or AIDS (acquired immunodeficiency syndrome). What are the signs or symptoms? Symptoms of this condition include: Tiredness (fatigue). Loss of appetite. Nausea or vomiting. Pain in your abdomen. Dark yellow urine. Yellowing of your skin or the white parts of your eyes (jaundice). Itchy skin. Light-colored or tan stool. Joint pain. Bleeding and bruising that happen often. Fluid buildup in your stomach (ascites). Often, hepatitis C causes no symptoms. How is this diagnosed? This condition is diagnosed with: Blood tests. Other tests that show how well your liver is working. These tests may include: Magnetic resonance elastography (MRE). This imaging test uses MRI and sound waves to measure liver stiffness. Transient elastography. This imaging test uses ultrasound to measure liver stiffness. Liver biopsy. In this test, a tissue sample is taken from your liver and looked at under a microscope. How is this treated? Treatment may depend on how severe your condition is, how long it has lasted, and whether you have liver damage. Treatment may include: Taking antiviral medicines and other medicines. Having follow-up treatments every 6-12 months for infections or other liver problems. Having a liver transplant. Follow these instructions at home: Medicines Take over-the-counter and prescription medicines only as told by your health care provider. If you were prescribed an antiviral medicine, take it as told by your health care provider. Do not stop using the antiviral even if you start to feel better. Do not take any new medicines, including over-the-counter medicines or supplements, unless your health care provider  approves. Activity Rest as needed. Do not have sex unless your health care provider approves. Avoid swimming or using hot tubs if you have open sores or wounds. Return to  your normal activities as told by your health care provider. Ask your health care provider what activities are safe for you. Ask your health care provider when you may return to school or work. Eating and drinking  Eat a balanced diet with plenty of fruits and vegetables, whole grains, and lean meats or non-meat proteins, such as beans or tofu. Drink enough fluid to keep your urine pale yellow. Do not drink alcohol. General instructions Do not share toothbrushes, nail clippers, or razors. Wash your hands often with soap and water for at least 20 seconds. If soap and water are not available, use alcohol-based hand sanitizer. Cover any cuts or open sores on your skin to prevent spreading HCV. Keep all follow-up visits. This is important. You may need follow-up visits every 6-12 months. How is this prevented? There is no vaccine for hepatitis C. You can lessen your risk of coming into contact with HCV by making sure you: Wash your hands often with soap and water for at least 20 seconds. Do not share needles or syringes. Use a condom every time you have vaginal, oral, or anal sex. Latex condoms offer the best protection. Avoid handling blood or other body fluids without gloves or other protection. Avoid getting tattoos or body piercings in shops that are not clean. Where to find more information Centers for Disease Control and Prevention: InternetEnthusiasts.hu World Health Organization: RoleLink.com.br Contact a health care provider if you: Have a fever or chills. Have pain or swelling in your abdomen. Pass dark urine. Pass light-colored or tan stool. Have joint pain. Get help right away if you: Have more fatigue. Lose your appetite. Cannot eat or drink without vomiting. Develop jaundice, or your jaundice gets worse. Bruise or bleed easily. Summary Hepatitis C is a liver infection that is caused by the hepatitis C virus (HCV). This infection can lead to a loss of liver function (liver  failure), scarring of the liver (cirrhosis), or liver cancer. HCV can spread from person to person (is contagious). Do not take any medicines, including over-the-counter medicines or supplements, unless your health care provider approves. This information is not intended to replace advice given to you by your health care provider. Make sure you discuss any questions you have with your health care provider. Document Revised: 10/23/2020 Document Reviewed: 10/23/2020 Elsevier Patient Education  [place].

## 2022-11-03 LAB — HCV RNA QUANT
HCV Quantitative Log: 6.715 log10 IU/mL (ref >1.70)
HCV Quantitative: 5190000 IU/mL (ref >50)

## 2022-11-05 LAB — HCV FIBROSURE
ALPHA 2-MACROGLOBULINS, QN: 163 mg/dL (ref 110–276)
ALT (SGPT) P5P: 132 IU/L — ABNORMAL HIGH (ref 0–55)
Apolipoprotein A-1: 141 mg/dL (ref 101–178)
Bilirubin, Total: 0.3 mg/dL (ref 0.0–1.2)
Fibrosis Score: 0.1 (ref 0.00–0.21)
GGT: 33 IU/L (ref 0–65)
Haptoglobin: 75 mg/dL (ref 17–317)
Necroinflammat Activity Score: 0.61 — ABNORMAL HIGH (ref 0.00–0.17)

## 2022-11-07 LAB — HEPATITIS C GENOTYPE

## 2022-11-12 ENCOUNTER — Ambulatory Visit (HOSPITAL_COMMUNITY)
Admission: RE | Admit: 2022-11-12 | Discharge: 2022-11-12 | Disposition: A | Discharge status: Home / Self Care | Payer: Self-pay | Source: Ambulatory Visit | Attending: Gastroenterology | Admitting: Gastroenterology

## 2022-11-12 DIAGNOSIS — K6289 Other specified diseases of anus and rectum: Secondary | ICD-10-CM | POA: Insufficient documentation

## 2022-11-12 DIAGNOSIS — R768 Other specified abnormal immunological findings in serum: Secondary | ICD-10-CM | POA: Insufficient documentation

## 2022-11-15 ENCOUNTER — Ambulatory Visit: Payer: Self-pay | Admitting: Physician Assistant

## 2022-11-15 ENCOUNTER — Encounter: Payer: Self-pay | Admitting: Physician Assistant

## 2022-11-15 VITALS — BP 124/75 | HR 98 | Temp 98.0°F | Wt 166.0 lb

## 2022-11-15 DIAGNOSIS — K029 Dental caries, unspecified: Secondary | ICD-10-CM

## 2022-11-15 DIAGNOSIS — K0889 Other specified disorders of teeth and supporting structures: Secondary | ICD-10-CM

## 2022-11-15 MED ORDER — AMOXICILLIN 500 MG PO CAPS: 500.0000 mg | ORAL_CAPSULE | Freq: Three times a day (TID) | ORAL | 0 refills | Status: AC

## 2022-11-15 NOTE — Progress Notes (Signed)
   BP 124/75   Pulse 98   Temp 98 F (36.7 C)   Wt 166 lb (75.3 kg)   SpO2 98%   BMI 23.48 kg/m    Subjective:    Patient ID: Larry Wilson, male    DOB: 1991/02/20, 31 y.o.   MRN: [id number]  HPI: Larry Wilson is a 31 y.o. male presenting on 11/15/2022 for Dental Problem   HPI  Chief Complaint  Patient presents with   Dental Problem     Pt says his Tooth broken for long time.   he says the pain started 2-3 days ago.   He says it feels like "A pocket of squishiness".  He says IBU helps some.    Relevant past medical, surgical, family and social history reviewed and updated as indicated. Interim medical history since our last visit reviewed. Allergies and medications reviewed and updated.   CURRENT MEDS: IBU seroquel   Review of Systems  Per HPI unless specifically indicated above     Objective:    BP 124/75   Pulse 98   Temp 98 F (36.7 C)   Wt 166 lb (75.3 kg)   SpO2 98%   BMI 23.48 kg/m   Wt Readings from Last 3 Encounters:  11/15/22 166 lb (75.3 kg)  11/02/22 160 lb 3.2 oz (72.7 kg)  10/05/22 152 lb 8 oz (69.2 kg)    Physical Exam Constitutional:      General: He is not in acute distress.    Appearance: He is not toxic-appearing.  HENT:     Head: Normocephalic and atraumatic.     Jaw: No trismus or swelling.     Salivary Glands: Right salivary gland is not diffusely enlarged or tender.     Mouth/Throat:     Dentition: Abnormal dentition. Dental tenderness and dental caries present.     Comments: Right upper molars decayed down to gum line with some inflammation and tenderness.  No discrete abscess seen.  There is no swelling of the face. Pulmonary:     Effort: Pulmonary effort is normal. No respiratory distress.  Skin:    General: Skin is warm and dry.  Neurological:     Mental Status: He is alert and oriented to person, place, and time.  Psychiatric:        Behavior: Behavior normal.           Assessment & Plan:     Encounter Diagnoses  Name Primary?   Dental decay Yes   Dentalgia      -rx amoxil -refer to see dentist soon -pt to follow [place] as scheduled.  He is to RTO sooner prn

## 2022-11-18 ENCOUNTER — Telehealth: Payer: Self-pay

## 2022-11-22 ENCOUNTER — Other Ambulatory Visit: Payer: Self-pay | Admitting: Gastroenterology

## 2022-11-24 ENCOUNTER — Telehealth: Payer: Self-pay | Admitting: Gastroenterology

## 2022-11-24 ENCOUNTER — Other Ambulatory Visit: Payer: Self-pay | Admitting: Gastroenterology

## 2022-11-24 MED ORDER — SOFOSBUVIR-VELPATASVIR 400-100 MG PO TABS: 1.0000 | ORAL_TABLET | Freq: Every day | ORAL | 2 refills | Status: DC

## 2022-11-24 NOTE — Telephone Encounter (Signed)
RX sent for [employer]. Let's make sure it gets shipped to our practice. Patient will need to sign appropriate paperwork below.   He will need OV after first month of treatment. He will need CMET, HCV RNA after four weeks of Epclusa.    Epclusa for Hepatitis C Instructions:   Take Epclusa, one tablet daily with or without food. You should take it at approximately the same time every day.  Do not miss a dose. You will complete 12 weeks of treatment.   Do not run out of Epclusa! If you are down to one week of medication left and have not heard about your next shipment, please let us know as soon as possible.   If you need to start a new medication, prescription from your doctor or over the counter medication, you need to contact us to make sure it does not interfere with Epclusa. There are several medications that can interfere with Epclusa and can make you sick or make the medication not work.  If you need to take a medication for acid reflux, your choices are as follows: TUMS or Rolaids separated at least four hours from [place].  Pepcid (famotidine) 40mg  up to twice per day administered with Epclusa or 12 hours apart from .    Tylenol (acetominophen) and Advil (ibuprofen) are safe to take with Epclusa if needed for headache, fever, pain.   You will have and appointment and blood work done after 4 weeks of treatment to make sure you are tolerating Epclusa and to see if the medication is getting rid of the Hepatitis C.   DO NOT stop Epclusa unless instructed to by your provider.  You will also have blood work done at the end of treatment and 12-24 weeks after end of treatment.        Patient signature: ____________________  Date: _______________

## 2022-11-26 ENCOUNTER — Telehealth: Payer: Self-pay

## 2022-11-26 MED ORDER — SOFOSBUVIR-VELPATASVIR 400-100 MG PO TABS: 1.0000 | ORAL_TABLET | Freq: Every day | ORAL | 2 refills | Status: AC

## 2022-11-26 NOTE — Telephone Encounter (Signed)
done

## 2022-11-26 NOTE — Telephone Encounter (Signed)
Please resend Rx for Epclusa to [facility] (pharmacy is listed under preferred pharmacies in chart) Spoke with [facility] and pt does not have insurance. Will need to submit all info through [employer] to get pt approved.

## 2022-11-26 NOTE — Addendum Note (Signed)
Addended by: Tiffany Kocher on: 11/26/2022 10:48 AM   Modules accepted: Orders

## 2022-11-30 ENCOUNTER — Other Ambulatory Visit (HOSPITAL_COMMUNITY): Payer: Self-pay

## 2022-12-01 ENCOUNTER — Other Ambulatory Visit (HOSPITAL_COMMUNITY): Payer: Self-pay

## 2022-12-15 ENCOUNTER — Other Ambulatory Visit: Payer: Self-pay

## 2022-12-15 DIAGNOSIS — R768 Other specified abnormal immunological findings in serum: Secondary | ICD-10-CM

## 2022-12-15 NOTE — Telephone Encounter (Signed)
noted 

## 2022-12-15 NOTE — Telephone Encounter (Signed)
Medication is scheduled to be delivered on 12/22/2022 to the office. Facility/patient has been notified. Pt is needing an appt scheduled around Feb 01 for medication follow up. Labs have also been ordered and will be given to pt closer to time to have completed.

## 2022-12-22 ENCOUNTER — Emergency Department (HOSPITAL_COMMUNITY)
Admission: EM | Admit: 2022-12-22 | Discharge: 2022-12-22 | Discharge status: Against Medical Advice | Payer: Medicaid Other

## 2022-12-22 NOTE — ED Triage Notes (Signed)
Pt informed by registration that pt has left the lobby prior to triage.

## 2023-01-13 ENCOUNTER — Telehealth: Payer: Self-pay

## 2023-01-13 ENCOUNTER — Other Ambulatory Visit: Payer: Self-pay

## 2023-01-13 DIAGNOSIS — R768 Other specified abnormal immunological findings in serum: Secondary | ICD-10-CM

## 2023-01-13 NOTE — Telephone Encounter (Signed)
Shipment for pt's Larry Wilson has been scheduled for 01/17/2023. Pt has been notified.

## 2023-01-17 ENCOUNTER — Encounter: Payer: Self-pay | Admitting: Family Medicine

## 2023-01-17 ENCOUNTER — Ambulatory Visit (INDEPENDENT_AMBULATORY_CARE_PROVIDER_SITE_OTHER): Payer: Medicaid Other | Admitting: Family Medicine

## 2023-01-17 VITALS — BP 131/83 | HR 91 | Ht 70.0 in | Wt 177.0 lb

## 2023-01-17 DIAGNOSIS — Z114 Encounter for screening for human immunodeficiency virus [HIV]: Secondary | ICD-10-CM | POA: Diagnosis not present

## 2023-01-17 DIAGNOSIS — R7301 Impaired fasting glucose: Secondary | ICD-10-CM

## 2023-01-17 DIAGNOSIS — E039 Hypothyroidism, unspecified: Secondary | ICD-10-CM | POA: Diagnosis not present

## 2023-01-17 DIAGNOSIS — Z0001 Encounter for general adult medical examination with abnormal findings: Secondary | ICD-10-CM | POA: Diagnosis not present

## 2023-01-17 DIAGNOSIS — E785 Hyperlipidemia, unspecified: Secondary | ICD-10-CM

## 2023-01-17 NOTE — Progress Notes (Unsigned)
Complete physical exam  Patient: Larry Wilson   DOB: 06-14-1991   32 y.o. Male  MRN: [id number]  Subjective:    Chief Complaint  Patient presents with   [name] is a 32 y.o. male who presents today for a complete physical exam. He reports consuming a general diet. The patient does not participate in regular exercise at present. He generally feels well. He reports sleeping poorly. He does not have additional problems to discuss today.    Most recent fall risk assessment:    01/17/2023    2:55 PM  [place] in the past year? 0  Number falls in past yr: 0  Injury with Fall? 0  Risk for fall due to : No Fall Risks  Follow up Falls evaluation completed     Most recent depression screenings:    01/17/2023    2:55 PM  PHQ 2/9 Scores  PHQ - 2 Score 2  PHQ- 9 Score 12    Vision:Not within last year   Patient Care Team: Del Ria Comment, Lamar Benes, FNP as PCP - General (Family Medicine)   Outpatient Medications Prior to Visit  Medication Sig   QUEtiapine (SEROQUEL) 100 MG tablet Take 100 mg by mouth at bedtime.   sertraline (ZOLOFT) 50 MG tablet Take 1 tablet by mouth daily.   Sofosbuvir-Velpatasvir (EPCLUSA) 400-100 MG TABS Take 1 tablet by mouth daily.   atomoxetine (STRATTERA) 40 MG capsule Take by mouth. (Patient not taking: Reported on 01/17/2023)   Docusate Sodium (STOOL SOFTENER LAXATIVE PO) Take by mouth.   IBUPROFEN PO Take 500 mg by mouth every 6 (six) hours as needed (for pain).   pramoxine-hydrocortisone (EPIFOAM) 1-1 % foam Apply topically 3 (three) times daily as needed.   shark liver oil-cocoa butter (PREPARATION H) 0.25-3-85.5 % suppository Place 1 suppository rectally as needed for hemorrhoids.   No facility-administered medications prior to visit.    Review of Systems  Constitutional:  Negative for chills and fever.  HENT:  Negative for ear pain.   Eyes:  Negative for blurred vision.  Respiratory:  Negative for  shortness of breath.   Cardiovascular:  Negative for chest pain.  Gastrointestinal:  Negative for abdominal pain, nausea and vomiting.  Genitourinary:  Negative for dysuria.  Musculoskeletal:  Positive for joint pain.  Skin:  Negative for rash.  Neurological:  Positive for headaches. Negative for tingling, speech change, focal weakness and weakness.  Psychiatric/Behavioral:  Negative for substance abuse and suicidal ideas. The patient is not nervous/anxious.        Objective:    BP 131/83   Pulse 91   Ht 5\' 10"  (1.778 m)   Wt 177 lb (80.3 kg)   SpO2 97%   BMI 25.40 kg/m  BP Readings from Last 3 Encounters:  01/17/23 131/83  11/15/22 124/75  11/02/22 115/84      Physical Exam Vitals reviewed.  HENT:     Head: Normocephalic.     Right Ear: Tympanic membrane normal.     Left Ear: Tympanic membrane normal.     Nose: Nose normal.  Eyes:     Extraocular Movements: Extraocular movements intact.     Pupils: Pupils are equal, round, and reactive to light.  Cardiovascular:     Rate and Rhythm: Normal rate and regular rhythm.     Pulses: Normal pulses.     Heart sounds: Normal heart sounds. No murmur heard. Pulmonary:  Effort: Pulmonary effort is normal.     Breath sounds: Normal breath sounds.  Abdominal:     General: Bowel sounds are normal. There is no distension.     Palpations: Abdomen is soft.  Musculoskeletal:        General: Normal range of motion.     Cervical back: Neck supple.  Skin:    General: Skin is warm and dry.     Capillary Refill: Capillary refill takes less than 2 seconds.  Neurological:     General: No focal deficit present.     Mental Status: He is alert.     Motor: No weakness.     Coordination: Coordination normal.     Gait: Gait normal.  Psychiatric:        Mood and Affect: Mood normal.      No results found for any visits on 01/17/23.    Assessment & Plan:    Routine Health Maintenance and Physical Exam  Immunization History   Administered Date(s) Administered   Tdap 01/17/2017    Health Maintenance  Topic Date Due   COVID-19 Vaccine (1) 02/02/2023 (Originally 10/08/1991)   INFLUENZA VACCINE  03/20/2023 (Originally 07/20/2022)   DTaP/Tdap/Td (2 - Td or Tdap) 01/17/2027   Hepatitis C Screening  Completed   HIV Screening  Completed   HPV VACCINES  Aged Out    Discussed health benefits of physical activity, and encouraged him to engage in regular exercise appropriate for his age and condition.  IFG (impaired fasting glucose) -     Hemoglobin A1c  Encounter for screening for HIV -     HIV Antibody (routine testing w rflx)  Hypothyroidism, unspecified type -     TSH + free T4  Hyperlipidemia, unspecified hyperlipidemia type -     Lipid panel -     CMP14+EGFR -     CBC with Differential/Platelet  Encounter for routine adult physical exam with abnormal findings Assessment & Plan: Physical exam done, labs ordered. Patient BMI 25.40 Encouraged to start lifestyle modifications follow diet low in saturated fat, reduce dietary salt intake, avoid fatty foods, maintain an exercise routine 3 to 5 days a week for a minimum total of 150 minutes.      No follow-ups on file.     Renard Hamper Ria Comment, FNP

## 2023-01-17 NOTE — Patient Instructions (Signed)
It was pleasure to meet with you today Please follow up in 2 weeks.

## 2023-01-17 NOTE — Assessment & Plan Note (Signed)
Physical exam done, labs ordered. Patient BMI 25.40 Encouraged to start lifestyle modifications follow diet low in saturated fat, reduce dietary salt intake, avoid fatty foods, maintain an exercise routine 3 to 5 days a week for a minimum total of 150 minutes.

## 2023-01-18 LAB — TSH+FREE T4
Free T4: 0.74 ng/dL — ABNORMAL LOW (ref 0.82–1.77)
TSH: 1.53 u[IU]/mL (ref 0.450–4.500)

## 2023-01-18 LAB — LIPID PANEL
Chol/HDL Ratio: 7.5 ratio — ABNORMAL HIGH (ref 0.0–5.0)
Cholesterol, Total: 224 mg/dL — ABNORMAL HIGH (ref 100–199)
HDL: 30 mg/dL — ABNORMAL LOW (ref >39)
LDL Chol Calc (NIH): 91 mg/dL (ref 0–99)
Triglycerides: 623 mg/dL (ref 0–149)
VLDL Cholesterol Cal: 103 mg/dL — ABNORMAL HIGH (ref 5–40)

## 2023-01-18 LAB — CBC WITH DIFFERENTIAL/PLATELET
Basophils Absolute: 0.1 10*3/uL (ref 0.0–0.2)
Basos: 1 %
EOS (ABSOLUTE): 0.2 10*3/uL (ref 0.0–0.4)
Eos: 2 %
Hematocrit: 43.4 % (ref 37.5–51.0)
Hemoglobin: 14.8 g/dL (ref 13.0–17.7)
Immature Grans (Abs): 0 10*3/uL (ref 0.0–0.1)
Immature Granulocytes: 0 %
Lymphocytes Absolute: 2.1 10*3/uL (ref 0.7–3.1)
Lymphs: 27 %
MCH: 29.7 pg (ref 26.6–33.0)
MCHC: 34.1 g/dL (ref 31.5–35.7)
MCV: 87 fL (ref 79–97)
Monocytes Absolute: 0.6 10*3/uL (ref 0.1–0.9)
Monocytes: 7 %
Neutrophils Absolute: 4.8 10*3/uL (ref 1.4–7.0)
Neutrophils: 63 %
Platelets: 232 10*3/uL (ref 150–450)
RBC: 4.99 x10E6/uL (ref 4.14–5.80)
RDW: 13.1 % (ref 11.6–15.4)
WBC: 7.8 10*3/uL (ref 3.4–10.8)

## 2023-01-18 LAB — CMP14+EGFR
ALT: 18 IU/L (ref 0–44)
AST: 31 IU/L (ref 0–40)
Albumin/Globulin Ratio: 1.8 (ref 1.2–2.2)
Albumin: 4.5 g/dL (ref 4.3–5.2)
Alkaline Phosphatase: 73 IU/L (ref 44–121)
BUN/Creatinine Ratio: 16 (ref 9–20)
BUN: 11 mg/dL (ref 6–20)
Bilirubin Total: <0.2 mg/dL (ref 0.0–1.2)
CO2: 24 mmol/L (ref 20–29)
Calcium: 9.3 mg/dL (ref 8.7–10.2)
Chloride: 103 mmol/L (ref 96–106)
Creatinine, Ser: 0.7 mg/dL — ABNORMAL LOW (ref 0.76–1.27)
Globulin, Total: 2.5 g/dL (ref 1.5–4.5)
Glucose: 88 mg/dL (ref 70–99)
Potassium: 4.2 mmol/L (ref 3.5–5.2)
Sodium: 140 mmol/L (ref 134–144)
Total Protein: 7 g/dL (ref 6.0–8.5)
eGFR: 128 mL/min/{1.73_m2} (ref >59)

## 2023-01-18 LAB — HIV ANTIBODY (ROUTINE TESTING W REFLEX): HIV Screen 4th Generation wRfx: NONREACTIVE

## 2023-01-18 LAB — HEMOGLOBIN A1C
Est. average glucose Bld gHb Est-mCnc: 105 mg/dL
Hgb A1c MFr Bld: 5.3 % (ref 4.8–5.6)

## 2023-01-18 NOTE — Telephone Encounter (Signed)
Pt's Larry Wilson was delivered today. Pt was made aware.

## 2023-01-19 ENCOUNTER — Ambulatory Visit (INDEPENDENT_AMBULATORY_CARE_PROVIDER_SITE_OTHER): Payer: Self-pay | Admitting: Gastroenterology

## 2023-01-19 ENCOUNTER — Encounter: Payer: Self-pay | Admitting: Gastroenterology

## 2023-01-19 VITALS — BP 110/69 | HR 90 | Temp 98.2°F | Ht 70.5 in | Wt 178.2 lb

## 2023-01-19 DIAGNOSIS — R768 Other specified abnormal immunological findings in serum: Secondary | ICD-10-CM

## 2023-01-19 NOTE — Progress Notes (Signed)
GI Office Note    Referring Provider: Soyla Dryer, PA-C Primary Care Physician:  Juanda Chance, [place]  Primary Gastroenterologist: Elon Alas. Abbey Chatters, DO   Chief Complaint   Chief Complaint  Patient presents with   Follow-up    Here for medication folllow up    History of Present Illness   Larry Wilson is a 32 y.o. male presenting today for follow up Hep C.   He has completed four weeks of Epclusa. Seems to be tolerating. Compliant with medication. Denies drug use/remains clean. He complains of fatigue but also has had significant insomnia. He is on Seroquel but it is dosed at 8:30 at night at [facility]. He doesn't fall asleep until around 9:30-10 and has to get up by 5am. He denies abdominal pain. Appetite good. He denies brbpr, melena. BMs regular. Rarely having rectal pain at this point. Suspected anorectal fissure or hemorrhoid previously.     Medications   Current Outpatient Medications  Medication Sig Dispense Refill   IBUPROFEN PO Take 500 mg by mouth every 6 (six) hours as needed (for pain).     QUEtiapine (SEROQUEL) 100 MG tablet Take 100 mg by mouth at bedtime.     Sofosbuvir-Velpatasvir (EPCLUSA) 400-100 MG TABS Take 1 tablet by mouth daily. 28 tablet 2   No current facility-administered medications for this visit.    Allergies   Allergies as of 01/19/2023   (No Known Allergies)          Review of Systems   General: Negative for anorexia, weight loss, fever, chills, fatigue, weakness. ENT: Negative for hoarseness, difficulty swallowing , nasal congestion. CV: Negative for chest pain, angina, palpitations, dyspnea on exertion, peripheral edema.  Respiratory: Negative for dyspnea at rest, dyspnea on exertion, cough, sputum, wheezing.  GI: See history of present illness. GU:  Negative for dysuria, hematuria, urinary incontinence, urinary frequency, nocturnal urination.  Endo: Negative for unusual weight change.     Physical Exam   BP  110/69 (BP Location: Right Arm, Patient Position: Sitting, Cuff Size: Large)   Pulse 90   Temp 98.2 F (36.8 C) (Oral)   Ht 5' 10.5" (1.791 m)   Wt 178 lb 3.2 oz (80.8 kg)   SpO2 96%   BMI 25.21 kg/m    General: Well-nourished, well-developed in no acute distress.  Eyes: No icterus. Mouth: Oropharyngeal mucosa moist and pink ,poor dentition Extremities: No lower extremity edema. No clubbing or deformities. Neuro: Alert and oriented x 4   Skin: Warm and dry, no jaundice.   Psych: Alert and cooperative, normal mood and affect.  Labs   Lab Results  Component Value Date   CREATININE 0.70 (L) 01/17/2023   BUN 11 01/17/2023   NA 140 01/17/2023   K 4.2 01/17/2023   CL 103 01/17/2023   CO2 24 01/17/2023   Lab Results  Component Value Date   WBC 7.8 01/17/2023   HGB 14.8 01/17/2023   HCT 43.4 01/17/2023   MCV 87 01/17/2023   PLT 232 01/17/2023   Lab Results  Component Value Date   ALT 18 01/17/2023   AST 31 01/17/2023   ALKPHOS 73 01/17/2023   BILITOT <0.2 01/17/2023   Lab Results  Component Value Date   TSH 1.530 01/17/2023   Lab Results  Component Value Date   HGBA1C 5.3 01/17/2023    Imaging Studies   No results found.  Assessment   Hep C: doing well on Epclusa, completed 4 weeks. His AST/ALT  are now normal. HCV RNA pending.    PLAN   Await HCV RNA. Complete 12 weeks of Epclusa.  Call with any medication changes.    Laureen Ochs. Bobby Rumpf, [place], [facility]

## 2023-01-19 NOTE — Patient Instructions (Signed)
We will be in touch after your Hep C lab results return.  Continue sofosbuvir-velpatasvir one daily. You have 8 weeks to go. Call if you have any change in medications.

## 2023-01-20 ENCOUNTER — Other Ambulatory Visit (HOSPITAL_COMMUNITY)
Admission: RE | Admit: 2023-01-20 | Discharge: 2023-01-20 | Disposition: A | Discharge status: Home / Self Care | Payer: Medicaid Other | Source: Ambulatory Visit | Attending: Gastroenterology | Admitting: Gastroenterology

## 2023-01-20 DIAGNOSIS — R768 Other specified abnormal immunological findings in serum: Secondary | ICD-10-CM | POA: Diagnosis present

## 2023-01-20 LAB — COMPREHENSIVE METABOLIC PANEL
ALT: 22 U/L (ref 0–44)
AST: 27 U/L (ref 15–41)
Albumin: 4.1 g/dL (ref 3.5–5.0)
Alkaline Phosphatase: 64 U/L (ref 38–126)
Anion gap: 8 (ref 5–15)
BUN: 16 mg/dL (ref 6–20)
CO2: 26 mmol/L (ref 22–32)
Calcium: 8.8 mg/dL — ABNORMAL LOW (ref 8.9–10.3)
Chloride: 104 mmol/L (ref 98–111)
Creatinine, Ser: 1.06 mg/dL (ref 0.61–1.24)
GFR, Estimated: >60 mL/min (ref >60)
Glucose, Bld: 119 mg/dL — ABNORMAL HIGH (ref 70–99)
Potassium: 3.5 mmol/L (ref 3.5–5.1)
Sodium: 138 mmol/L (ref 135–145)
Total Bilirubin: 0.4 mg/dL (ref 0.3–1.2)
Total Protein: 7.3 g/dL (ref 6.5–8.1)

## 2023-01-22 LAB — HCV AB W REFLEX TO QUANT PCR: HCV Ab: REACTIVE — AB

## 2023-01-22 LAB — HCV RT-PCR, QUANT (NON-GRAPH): Hepatitis C Quantitation: NOT DETECTED IU/mL

## 2023-01-22 NOTE — Progress Notes (Signed)
Please inform patient, Cholesterol levels elevated, start lifestyle modifications follow diet low in saturated fat, reduce dietary salt intake, avoid fatty foods, maintain an exercise routine 3 to 5 days a week for a minimum total of 150 minutes.

## 2023-01-31 ENCOUNTER — Ambulatory Visit (INDEPENDENT_AMBULATORY_CARE_PROVIDER_SITE_OTHER): Payer: Medicaid Other | Admitting: Family Medicine

## 2023-01-31 ENCOUNTER — Encounter: Payer: Self-pay | Admitting: Family Medicine

## 2023-01-31 VITALS — BP 129/82 | HR 90 | Ht 70.0 in | Wt 181.0 lb

## 2023-01-31 DIAGNOSIS — M5440 Lumbago with sciatica, unspecified side: Secondary | ICD-10-CM

## 2023-01-31 DIAGNOSIS — G8929 Other chronic pain: Secondary | ICD-10-CM | POA: Diagnosis not present

## 2023-01-31 MED ORDER — GABAPENTIN 100 MG PO CAPS: 100.0000 mg | ORAL_CAPSULE | Freq: Two times a day (BID) | ORAL | 2 refills | Status: DC

## 2023-01-31 NOTE — Progress Notes (Unsigned)
  Patient Office Visit   Subjective   Patient ID: Larry Wilson, male    DOB: [birth year]  Age: 32 y.o. MRN: [id number]  CC:  Chief Complaint  Patient presents with   Pain    Patient states he is still having nerve pain all over his body.     HPI [name] presents to establish care. He  has a past medical history of ADHD (attention deficit hyperactivity disorder), Anxiety, Depression, and Polysubstance abuse ([place]).  Back Pain This is a chronic problem. The current episode started more than 1 month ago. The problem occurs constantly. The problem is unchanged. The pain is present in the lumbar spine. The quality of the pain is described as aching. The pain does not radiate. The pain is at a severity of 5/10. The pain is moderate. The pain is The same all the time. The symptoms are aggravated by bending, stress and twisting. Stiffness is present All day and in the morning. Pertinent negatives include no abdominal pain, numbness, tingling or weakness. He has tried analgesics for the symptoms. The treatment provided no relief.     Outpatient Encounter Medications as of 01/31/2023  Medication Sig   IBUPROFEN PO Take 500 mg by mouth every 6 (six) hours as needed (for pain).   QUEtiapine (SEROQUEL) 100 MG tablet Take 200 mg by mouth at bedtime.   Sofosbuvir-Velpatasvir (EPCLUSA) 400-100 MG TABS Take 1 tablet by mouth daily.   No facility-administered encounter medications on file as of 01/31/2023.    Past Surgical History:  Procedure Laterality Date   none      Review of Systems  Gastrointestinal:  Negative for abdominal pain.  Musculoskeletal:  Positive for back pain.  Neurological:  Negative for tingling, weakness and numbness.      Objective    BP 129/82   Pulse 90   Ht 5\' 10"  (1.778 m)   Wt 181 lb (82.1 kg)   SpO2 97%   BMI 25.97 kg/m   Physical Exam    Assessment & Plan:  There are no diagnoses linked to this encounter.  No follow-ups on file.   Renard Hamper  Ria Comment, FNP

## 2023-01-31 NOTE — Patient Instructions (Signed)
It was pleasure meeting with you today. Please take medications as prescribed. Follow up with your primary health provider if any health concerns arises. If symptoms worsen please contact your primary care provider and/or visit the emergency department.

## 2023-02-01 DIAGNOSIS — M545 Low back pain, unspecified: Secondary | ICD-10-CM | POA: Insufficient documentation

## 2023-02-01 NOTE — Assessment & Plan Note (Addendum)
Left straight leg raise cause patient shooting pain to left leg and bilateral lower back. Discussed Non pharmacological interventions include rest, avoid twisting, straining lower back. Demonstration of proper body mechanics. Alternate ice and heat. Recommend stretching back and legs. Follow up for worsening or persistent symptoms. Patient verbalizes understanding regarding plan of care and all questions answered.  Prescribed gabapentin 100 mg twice daily to take as needed

## 2023-02-14 ENCOUNTER — Telehealth: Payer: Self-pay | Admitting: Family Medicine

## 2023-02-14 ENCOUNTER — Other Ambulatory Visit: Payer: Self-pay

## 2023-02-14 DIAGNOSIS — R768 Other specified abnormal immunological findings in serum: Secondary | ICD-10-CM

## 2023-02-14 NOTE — Telephone Encounter (Signed)
Pt is wanting to speak to a nurse about his medication?

## 2023-02-16 ENCOUNTER — Other Ambulatory Visit: Payer: Self-pay | Admitting: Family Medicine

## 2023-02-16 ENCOUNTER — Telehealth: Payer: Self-pay | Admitting: Family Medicine

## 2023-02-16 MED ORDER — GABAPENTIN 300 MG PO CAPS: 300.0000 mg | ORAL_CAPSULE | Freq: Three times a day (TID) | ORAL | 2 refills | Status: DC

## 2023-02-16 NOTE — Telephone Encounter (Signed)
Patient called in regard to gabapentin (NEURONTIN) 300 MG capsule [id number]    Patient needs script changed to be able to pick up .   Patient wants a call back

## 2023-02-16 NOTE — Telephone Encounter (Signed)
Patient states gabapentin is working for only a few hours. He is asking if he can increase the frequency of taking it or if dosage can be increased.

## 2023-02-16 NOTE — Telephone Encounter (Signed)
Patient called in regard to gabapentin (NEURONTIN) 300 MG capsule [id number]   Patient needs script changed to be able to pick up .  Patient wants a call back

## 2023-02-16 NOTE — Telephone Encounter (Signed)
Spoke with patient.

## 2023-02-16 NOTE — Telephone Encounter (Signed)
Spoke with patient. Note faxed.

## 2023-02-16 NOTE — Telephone Encounter (Signed)
Prescription sent

## 2023-02-18 ENCOUNTER — Other Ambulatory Visit: Payer: Self-pay | Admitting: Gastroenterology

## 2023-02-28 ENCOUNTER — Other Ambulatory Visit: Payer: Self-pay

## 2023-02-28 DIAGNOSIS — R768 Other specified abnormal immunological findings in serum: Secondary | ICD-10-CM

## 2023-03-15 ENCOUNTER — Other Ambulatory Visit: Payer: Self-pay | Admitting: Family Medicine

## 2023-03-15 DIAGNOSIS — G8929 Other chronic pain: Secondary | ICD-10-CM

## 2023-03-16 ENCOUNTER — Telehealth: Payer: Self-pay | Admitting: Family Medicine

## 2023-03-16 NOTE — Telephone Encounter (Signed)
Spoke with patient.

## 2023-03-16 NOTE — Telephone Encounter (Signed)
Pt wants to know if he can please get a call he has some questions

## 2023-03-17 ENCOUNTER — Other Ambulatory Visit (HOSPITAL_COMMUNITY)
Admission: RE | Admit: 2023-03-17 | Discharge: 2023-03-17 | Disposition: A | Discharge status: Home / Self Care | Payer: Medicaid Other | Source: Other Acute Inpatient Hospital | Attending: Gastroenterology | Admitting: Gastroenterology

## 2023-03-17 ENCOUNTER — Ambulatory Visit (INDEPENDENT_AMBULATORY_CARE_PROVIDER_SITE_OTHER): Payer: Medicaid Other | Admitting: Family Medicine

## 2023-03-17 ENCOUNTER — Encounter: Payer: Self-pay | Admitting: Family Medicine

## 2023-03-17 VITALS — BP 124/79 | HR 107 | Ht 70.0 in | Wt 191.0 lb

## 2023-03-17 DIAGNOSIS — R768 Other specified abnormal immunological findings in serum: Secondary | ICD-10-CM | POA: Insufficient documentation

## 2023-03-17 DIAGNOSIS — G8929 Other chronic pain: Secondary | ICD-10-CM | POA: Diagnosis not present

## 2023-03-17 DIAGNOSIS — M5441 Lumbago with sciatica, right side: Secondary | ICD-10-CM

## 2023-03-17 MED ORDER — GABAPENTIN 600 MG PO TABS: 600.0000 mg | ORAL_TABLET | Freq: Once | ORAL | 1 refills | Status: DC | PRN

## 2023-03-17 NOTE — Progress Notes (Signed)
New Patient Office Visit   Subjective   Patient ID: Larry Wilson, male    DOB: March 22, 1991  Age: 32 y.o. MRN: [id number]  CC:  Chief Complaint  Patient presents with   Pain Management    Patient is here for pain management f/u. States the place he is living does not allow him to have the dosage of gabapentin, so he is wanting an alternative dose. States they would approve 600mg  daily, instead of 900mg  daily. And wants ref. To ortho.     HPI Larry Wilson 32 year old male presents to clinic for continued back pain. He  has a past medical history of ADHD (attention deficit hyperactivity disorder), Anxiety, Depression, and Polysubstance abuse ([place]).  Back Pain This is a chronic problem. The current episode started more than 1 year ago. The problem occurs intermittently. The problem has been gradually worsening since onset. The pain is present in the lumbar spine. The quality of the pain is described as aching and burning. The pain radiates to the right thigh, right foot and right knee. The pain is at a severity of 10/10. The pain is The same all the time. The symptoms are aggravated by lying down, position, stress, twisting and standing. Stiffness is present All day. Associated symptoms include leg pain and weakness. Pertinent negatives include no abdominal pain, bladder incontinence, bowel incontinence, chest pain, dysuria, headaches, numbness, pelvic pain, perianal numbness or tingling. Risk factors include lack of exercise and poor posture. He has tried analgesics and NSAIDs for the symptoms. The treatment provided mild relief.      Outpatient Encounter Medications as of 03/17/2023  Medication Sig   gabapentin (NEURONTIN) 600 MG tablet Take 1 tablet (600 mg total) by mouth once as needed for up to 1 dose.   IBUPROFEN PO Take 500 mg by mouth every 6 (six) hours as needed (for pain).   QUEtiapine (SEROQUEL) 100 MG tablet Take 200 mg by mouth at bedtime.   Sofosbuvir-Velpatasvir  (EPCLUSA) 400-100 MG TABS Take 1 tablet by mouth daily.   gabapentin (NEURONTIN) 300 MG capsule Take 1 capsule (300 mg total) by mouth 3 (three) times daily. (Patient not taking: Reported on 03/17/2023)   No facility-administered encounter medications on file as of 03/17/2023.    Past Surgical History:  Procedure Laterality Date   none      Review of Systems  Eyes:  Negative for blurred vision.  Respiratory:  Negative for shortness of breath.   Cardiovascular:  Negative for chest pain.  Gastrointestinal:  Negative for abdominal pain and bowel incontinence.  Genitourinary:  Negative for bladder incontinence, dysuria and pelvic pain.  Musculoskeletal:  Positive for back pain.  Neurological:  Positive for weakness. Negative for dizziness, tingling, numbness and headaches.      Objective    BP 124/79   Pulse (!) 107   Ht 5\' 10"  (1.778 m)   Wt 191 lb (86.6 kg)   SpO2 97%   BMI 27.41 kg/m   Physical Exam HENT:     Head: Normocephalic.  Cardiovascular:     Rate and Rhythm: Normal rate and regular rhythm.     Pulses: Normal pulses.  Pulmonary:     Effort: Pulmonary effort is normal.  Abdominal:     General: Bowel sounds are normal.     Palpations: Abdomen is soft. There is no mass.     Tenderness: There is no right CVA tenderness or left CVA tenderness.  Musculoskeletal:     Cervical  back: Normal range of motion.     Lumbar back: No swelling, edema or tenderness. Normal range of motion. Positive right straight leg raise test. Negative left straight leg raise test.     Right lower leg: No edema.     Left lower leg: No edema.  Skin:    General: Skin is warm and dry.     Capillary Refill: Capillary refill takes less than 2 seconds.  Neurological:     General: No focal deficit present.     Mental Status: He is alert.     Coordination: Coordination normal.     Gait: Gait normal.       Assessment & Plan:  Chronic bilateral low back pain with right-sided  sciatica Assessment & Plan: Continued back pain Gabapentin 600 mg PRN  Xray ordered- awaiting results, will follow up Possible referral to physical therapy Explained to patient Non pharmacological interventions include the use of ice or heat, rest, recommend range of motion exercises, gentle stretching. Follow up for worsening or persistent symptoms. Patient verbalizes understanding regarding plan of care and all questions answered.   Orders: -     DG Lumbar Spine 2-3 Views; Future -     Gabapentin; Take 1 tablet (600 mg total) by mouth once as needed for up to 1 dose.  Dispense: 30 tablet; Refill: 1    No follow-ups on file.   Renard Hamper Ria Comment, FNP

## 2023-03-17 NOTE — Assessment & Plan Note (Signed)
Continued back pain Gabapentin 600 mg PRN  Xray ordered- awaiting results, will follow up Possible referral to physical therapy Explained to patient Non pharmacological interventions include the use of ice or heat, rest, recommend range of motion exercises, gentle stretching. Follow up for worsening or persistent symptoms. Patient verbalizes understanding regarding plan of care and all questions answered.

## 2023-03-17 NOTE — Patient Instructions (Signed)
It was pleasure meeting with you today. Please take medications as prescribed. Follow up with your primary health provider if any health concerns arises. If symptoms worsen please contact your primary care provider and/or visit the emergency department.  

## 2023-03-18 ENCOUNTER — Telehealth: Payer: Self-pay | Admitting: Family Medicine

## 2023-03-18 ENCOUNTER — Other Ambulatory Visit: Payer: Self-pay | Admitting: Family Medicine

## 2023-03-18 LAB — HCV RNA QUANT: HCV Quantitative: NOT DETECTED IU/mL (ref >50)

## 2023-03-18 MED ORDER — METHOCARBAMOL 500 MG PO TABS: 500.0000 mg | ORAL_TABLET | Freq: Once | ORAL | 3 refills | Status: AC | PRN

## 2023-03-18 MED ORDER — GABAPENTIN 300 MG PO CAPS: 300.0000 mg | ORAL_CAPSULE | Freq: Three times a day (TID) | ORAL | 2 refills | Status: AC | PRN

## 2023-03-18 NOTE — Telephone Encounter (Signed)
Spoke with [employer].

## 2023-03-18 NOTE — Telephone Encounter (Signed)
Marylyn Ishihara called from [facility] home he will be faxing over a authorization to speka to provider about patient medication. Has concerns on medication call back # [phone].

## 2023-03-21 ENCOUNTER — Ambulatory Visit (HOSPITAL_COMMUNITY)
Admission: RE | Admit: 2023-03-21 | Discharge: 2023-03-21 | Disposition: A | Discharge status: Home / Self Care | Payer: Medicaid Other | Source: Ambulatory Visit | Attending: Family Medicine | Admitting: Family Medicine

## 2023-03-21 DIAGNOSIS — G8929 Other chronic pain: Secondary | ICD-10-CM | POA: Insufficient documentation

## 2023-03-21 DIAGNOSIS — M5441 Lumbago with sciatica, right side: Secondary | ICD-10-CM | POA: Diagnosis not present

## 2023-03-21 NOTE — Progress Notes (Signed)
Please inform patient Xray results came back normal

## 2023-03-22 ENCOUNTER — Other Ambulatory Visit: Payer: Self-pay

## 2023-03-22 ENCOUNTER — Encounter (HOSPITAL_COMMUNITY): Payer: Self-pay | Admitting: Oral Surgery

## 2023-03-22 NOTE — H&P (Signed)
   Patient: Larry Wilson  PID: [id number]  DOB: 1990/12/28  SEX: Male   Patient referred by DDS for extraction teeth1, 2, 3, 12, 13, 15, 16, 17, 18, 19, 29, 30, 31, 32  CC: No pain. In half-way house for amphetamine addiction.  Past Medical History:  Smoker, Hepatitis C    Medications: Quetiapine    Allergies:     None    Surgeries:   None     Social History       Smoking: 1 ppd           Alcohol:n Drug use:n                             Exam: BMI 26.   No purulence, edema, fluctuance, trismus. Oral cancer screening negative. Pharynx clear. No lymphadenopathy.  Panorex: Impacted teeth # 1, 16, 17, 32, Caries teeth #2, 3, 12, 13, 15, 18, 19, 29, 30, 31.  Assessment: ASA 2. Non-restorable/ impacted teeth # 1, 2, 3, 12, 13, 15, 16, 17, 18, 19, 29, 30, 31, 32               Plan: Extraction Teeth # 1, 2, 3, 12, 13, 15, 16, 17, 18, 19, 29, 30, 31, 32. Alveoloplasty.   Hospital Day surgery.                 Rx: n               Risks and complications explained. Questions answered.   Gae Bon, DMD

## 2023-03-22 NOTE — Progress Notes (Signed)
Mr Larry Wilson denies chest pain or shortness of breath.  Patient denies having any s/s of Covid in his household, also denies any known exposure to Covid.  Mr. Larry Wilson denies any s/s of upper or lower respiratory in the past 8 weeks.   I instructed Mr. Larry Wilson to not smoke any cigarettes 24 hours prior to surgery.  I also instructed patient to not take any more Advil until Dr. Hoyt Koch says it is ok.  Mr. Larry Wilson has been clean of alcohol or controlled substances since 09/07/22.  Patient is living in a half way house and has a job.

## 2023-03-25 ENCOUNTER — Encounter (HOSPITAL_COMMUNITY): Payer: Self-pay | Admitting: Oral Surgery

## 2023-03-25 ENCOUNTER — Encounter (HOSPITAL_COMMUNITY)
Admission: RE | Disposition: A | Discharge status: Home / Self Care | Payer: Self-pay | Source: Home / Self Care | Attending: Oral Surgery

## 2023-03-25 ENCOUNTER — Ambulatory Visit (HOSPITAL_BASED_OUTPATIENT_CLINIC_OR_DEPARTMENT_OTHER): Payer: Medicaid Other | Admitting: Certified Registered Nurse Anesthetist

## 2023-03-25 ENCOUNTER — Other Ambulatory Visit: Payer: Self-pay

## 2023-03-25 ENCOUNTER — Ambulatory Visit (HOSPITAL_COMMUNITY)
Admission: RE | Admit: 2023-03-25 | Discharge: 2023-03-25 | Disposition: A | Discharge status: Home / Self Care | Payer: Medicaid Other | Attending: Oral Surgery | Admitting: Oral Surgery

## 2023-03-25 ENCOUNTER — Ambulatory Visit (HOSPITAL_COMMUNITY): Payer: Medicaid Other | Admitting: Certified Registered Nurse Anesthetist

## 2023-03-25 DIAGNOSIS — K0850 Unsatisfactory restoration of tooth, unspecified: Secondary | ICD-10-CM | POA: Diagnosis not present

## 2023-03-25 DIAGNOSIS — F17200 Nicotine dependence, unspecified, uncomplicated: Secondary | ICD-10-CM | POA: Insufficient documentation

## 2023-03-25 DIAGNOSIS — K011 Impacted teeth: Secondary | ICD-10-CM | POA: Insufficient documentation

## 2023-03-25 DIAGNOSIS — B1920 Unspecified viral hepatitis C without hepatic coma: Secondary | ICD-10-CM | POA: Insufficient documentation

## 2023-03-25 DIAGNOSIS — F419 Anxiety disorder, unspecified: Secondary | ICD-10-CM | POA: Insufficient documentation

## 2023-03-25 DIAGNOSIS — F32A Depression, unspecified: Secondary | ICD-10-CM | POA: Diagnosis not present

## 2023-03-25 HISTORY — DX: Personal history of urinary calculi: Z87.442

## 2023-03-25 HISTORY — DX: Inflammatory liver disease, unspecified: K75.9

## 2023-03-25 HISTORY — DX: Pneumonia, unspecified organism: J18.9

## 2023-03-25 HISTORY — PX: TOOTH EXTRACTION: SHX859

## 2023-03-25 LAB — CBC
HCT: 42.4 % (ref 39.0–52.0)
Hemoglobin: 14.9 g/dL (ref 13.0–17.0)
MCH: 29.6 pg (ref 26.0–34.0)
MCHC: 35.1 g/dL (ref 30.0–36.0)
MCV: 84.3 fL (ref 80.0–100.0)
Platelets: 182 10*3/uL (ref 150–400)
RBC: 5.03 MIL/uL (ref 4.22–5.81)
RDW: 13.1 % (ref 11.5–15.5)
WBC: 6.9 10*3/uL (ref 4.0–10.5)
nRBC: 0 % (ref 0.0–0.2)

## 2023-03-25 SURGERY — DENTAL RESTORATION/EXTRACTIONS
Anesthesia: General

## 2023-03-25 MED ORDER — LIDOCAINE-EPINEPHRINE 2 %-1:100000 IJ SOLN
INTRAMUSCULAR | Status: DC | PRN
  Administered 2023-03-25: 20 mL via INTRADERMAL

## 2023-03-25 MED ORDER — HYDROMORPHONE HCL 1 MG/ML IJ SOLN
INTRAMUSCULAR | Status: AC
  Filled 2023-03-25: qty 1

## 2023-03-25 MED ORDER — AMISULPRIDE (ANTIEMETIC) 5 MG/2ML IV SOLN: 10.0000 mg | Freq: Once | INTRAVENOUS | Status: DC | PRN

## 2023-03-25 MED ORDER — LIDOCAINE 2% (20 MG/ML) 5 ML SYRINGE
INTRAMUSCULAR | Status: AC
  Filled 2023-03-25: qty 5

## 2023-03-25 MED ORDER — SUGAMMADEX SODIUM 200 MG/2ML IV SOLN
INTRAVENOUS | Status: DC | PRN
  Administered 2023-03-25: 200 mg via INTRAVENOUS

## 2023-03-25 MED ORDER — PHENYLEPHRINE 80 MCG/ML (10ML) SYRINGE FOR IV PUSH (FOR BLOOD PRESSURE SUPPORT)
PREFILLED_SYRINGE | INTRAVENOUS | Status: DC | PRN
  Administered 2023-03-25 (×2): 160 ug via INTRAVENOUS

## 2023-03-25 MED ORDER — ORAL CARE MOUTH RINSE: 15.0000 mL | Freq: Once | OROMUCOSAL | Status: AC

## 2023-03-25 MED ORDER — ROCURONIUM BROMIDE 10 MG/ML (PF) SYRINGE
PREFILLED_SYRINGE | INTRAVENOUS | Status: AC
  Filled 2023-03-25: qty 10

## 2023-03-25 MED ORDER — IBUPROFEN 800 MG PO TABS: 800.0000 mg | ORAL_TABLET | Freq: Three times a day (TID) | ORAL | 0 refills | Status: AC | PRN

## 2023-03-25 MED ORDER — MEPERIDINE HCL 25 MG/ML IJ SOLN: 6.2500 mg | INTRAMUSCULAR | Status: DC | PRN

## 2023-03-25 MED ORDER — LACTATED RINGERS IV SOLN: INTRAVENOUS | Status: DC

## 2023-03-25 MED ORDER — PROPOFOL 10 MG/ML IV BOLUS
INTRAVENOUS | Status: AC
  Filled 2023-03-25: qty 20

## 2023-03-25 MED ORDER — PROMETHAZINE HCL 25 MG/ML IJ SOLN: 6.2500 mg | INTRAMUSCULAR | Status: DC | PRN

## 2023-03-25 MED ORDER — OXYMETAZOLINE HCL 0.05 % NA SOLN
NASAL | Status: DC | PRN
  Administered 2023-03-25: 2 via NASAL

## 2023-03-25 MED ORDER — PROPOFOL 10 MG/ML IV BOLUS
INTRAVENOUS | Status: DC | PRN
  Administered 2023-03-25: 200 mg via INTRAVENOUS

## 2023-03-25 MED ORDER — CHLORHEXIDINE GLUCONATE 0.12 % MT SOLN
15.0000 mL | Freq: Once | OROMUCOSAL | Status: AC
  Administered 2023-03-25: 15 mL via OROMUCOSAL
  Filled 2023-03-25: qty 15

## 2023-03-25 MED ORDER — HYDROMORPHONE HCL 1 MG/ML IJ SOLN
0.2500 mg | INTRAMUSCULAR | Status: DC | PRN
  Administered 2023-03-25 (×2): 0.25 mg via INTRAVENOUS
  Administered 2023-03-25: 0.5 mg via INTRAVENOUS

## 2023-03-25 MED ORDER — DEXAMETHASONE SODIUM PHOSPHATE 10 MG/ML IJ SOLN
INTRAMUSCULAR | Status: AC
  Filled 2023-03-25: qty 1

## 2023-03-25 MED ORDER — SODIUM CHLORIDE 0.9 % IR SOLN
Status: DC | PRN
  Administered 2023-03-25: 1000 mL

## 2023-03-25 MED ORDER — MIDAZOLAM HCL 2 MG/2ML IJ SOLN
INTRAMUSCULAR | Status: DC | PRN
  Administered 2023-03-25: 2 mg via INTRAVENOUS

## 2023-03-25 MED ORDER — DEXAMETHASONE SODIUM PHOSPHATE 10 MG/ML IJ SOLN
INTRAMUSCULAR | Status: DC | PRN
  Administered 2023-03-25: 10 mg via INTRAVENOUS

## 2023-03-25 MED ORDER — ONDANSETRON HCL 4 MG/2ML IJ SOLN
INTRAMUSCULAR | Status: AC
  Filled 2023-03-25: qty 2

## 2023-03-25 MED ORDER — AMOXICILLIN 500 MG PO CAPS: 500.0000 mg | ORAL_CAPSULE | Freq: Three times a day (TID) | ORAL | 0 refills | Status: AC

## 2023-03-25 MED ORDER — ROCURONIUM BROMIDE 10 MG/ML (PF) SYRINGE
PREFILLED_SYRINGE | INTRAVENOUS | Status: DC | PRN
  Administered 2023-03-25: 60 mg via INTRAVENOUS

## 2023-03-25 MED ORDER — OXYMETAZOLINE HCL 0.05 % NA SOLN
NASAL | Status: AC
  Filled 2023-03-25: qty 90

## 2023-03-25 MED ORDER — ONDANSETRON HCL 4 MG/2ML IJ SOLN
INTRAMUSCULAR | Status: DC | PRN
  Administered 2023-03-25: 4 mg via INTRAVENOUS

## 2023-03-25 MED ORDER — FENTANYL CITRATE (PF) 250 MCG/5ML IJ SOLN
INTRAMUSCULAR | Status: DC | PRN
  Administered 2023-03-25: 100 ug via INTRAVENOUS

## 2023-03-25 MED ORDER — LIDOCAINE 2% (20 MG/ML) 5 ML SYRINGE
INTRAMUSCULAR | Status: DC | PRN
  Administered 2023-03-25: 80 mg via INTRAVENOUS

## 2023-03-25 MED ORDER — 0.9 % SODIUM CHLORIDE (POUR BTL) OPTIME
TOPICAL | Status: DC | PRN
  Administered 2023-03-25: 1000 mL

## 2023-03-25 MED ORDER — PHENYLEPHRINE 80 MCG/ML (10ML) SYRINGE FOR IV PUSH (FOR BLOOD PRESSURE SUPPORT)
PREFILLED_SYRINGE | INTRAVENOUS | Status: AC
  Filled 2023-03-25: qty 10

## 2023-03-25 MED ORDER — LIDOCAINE-EPINEPHRINE 2 %-1:100000 IJ SOLN
INTRAMUSCULAR | Status: AC
  Filled 2023-03-25: qty 1

## 2023-03-25 MED ORDER — OXYCODONE HCL 5 MG/5ML PO SOLN: 5.0000 mg | Freq: Once | ORAL | Status: DC | PRN

## 2023-03-25 MED ORDER — FENTANYL CITRATE (PF) 250 MCG/5ML IJ SOLN
INTRAMUSCULAR | Status: AC
  Filled 2023-03-25: qty 5

## 2023-03-25 MED ORDER — OXYCODONE HCL 5 MG PO TABS: 5.0000 mg | ORAL_TABLET | Freq: Once | ORAL | Status: DC | PRN

## 2023-03-25 MED ORDER — MIDAZOLAM HCL 2 MG/2ML IJ SOLN
INTRAMUSCULAR | Status: AC
  Filled 2023-03-25: qty 2

## 2023-03-25 MED ORDER — CEFAZOLIN SODIUM-DEXTROSE 2-4 GM/100ML-% IV SOLN
2.0000 g | INTRAVENOUS | Status: AC
  Administered 2023-03-25: 2 g via INTRAVENOUS
  Filled 2023-03-25: qty 100

## 2023-03-25 SURGICAL SUPPLY — 37 items
BAG COUNTER SPONGE SURGICOUNT (BAG) IMPLANT
BAG SPNG CNTER NS LX DISP (BAG) ×1
BLADE SURG 15 STRL LF DISP TIS (BLADE) ×2 IMPLANT
BLADE SURG 15 STRL SS (BLADE) ×1
BUR CROSS CUT FISSURE 1.6 (BURR) ×2 IMPLANT
BUR EGG ELITE 4.0 (BURR) ×2 IMPLANT
CANISTER SUCT 3000ML PPV (MISCELLANEOUS) ×2 IMPLANT
COVER SURGICAL LIGHT HANDLE (MISCELLANEOUS) ×2 IMPLANT
DEFOGGER MIRROR 1QT (MISCELLANEOUS) IMPLANT
GAUZE PACKING FOLDED 2  STR (GAUZE/BANDAGES/DRESSINGS) ×1
GAUZE PACKING FOLDED 2 STR (GAUZE/BANDAGES/DRESSINGS) ×2 IMPLANT
GLOVE BIO SURGEON STRL SZ 6.5 (GLOVE) IMPLANT
GLOVE BIO SURGEON STRL SZ7 (GLOVE) IMPLANT
GLOVE BIO SURGEON STRL SZ8 (GLOVE) ×2 IMPLANT
GLOVE BIOGEL PI IND STRL 6.5 (GLOVE) IMPLANT
GLOVE BIOGEL PI IND STRL 7.0 (GLOVE) IMPLANT
GOWN STRL REUS W/ TWL LRG LVL3 (GOWN DISPOSABLE) ×2 IMPLANT
GOWN STRL REUS W/ TWL XL LVL3 (GOWN DISPOSABLE) ×2 IMPLANT
GOWN STRL REUS W/TWL LRG LVL3 (GOWN DISPOSABLE) ×1
GOWN STRL REUS W/TWL XL LVL3 (GOWN DISPOSABLE) ×1
IV NS 1000ML (IV SOLUTION) ×1
IV NS 1000ML BAXH (IV SOLUTION) ×2 IMPLANT
KIT BASIN OR (CUSTOM PROCEDURE TRAY) ×2 IMPLANT
KIT TURNOVER KIT B (KITS) ×2 IMPLANT
NDL HYPO 25GX1X1/2 BEV (NEEDLE) ×4 IMPLANT
NEEDLE HYPO 25GX1X1/2 BEV (NEEDLE) ×2 IMPLANT
NS IRRIG 1000ML POUR BTL (IV SOLUTION) ×2 IMPLANT
PAD ARMBOARD 7.5X6 YLW CONV (MISCELLANEOUS) ×2 IMPLANT
SLEEVE IRRIGATION ELITE 7 (MISCELLANEOUS) ×2 IMPLANT
SPIKE FLUID TRANSFER (MISCELLANEOUS) ×2 IMPLANT
SPONGE SURGIFOAM ABS GEL 12-7 (HEMOSTASIS) IMPLANT
SUT CHROMIC 3 0 PS 2 (SUTURE) ×2 IMPLANT
SYR BULB IRRIG 60ML STRL (SYRINGE) ×2 IMPLANT
SYR CONTROL 10ML LL (SYRINGE) ×2 IMPLANT
TRAY ENT MC OR (CUSTOM PROCEDURE TRAY) ×2 IMPLANT
TUBING IRRIGATION (MISCELLANEOUS) ×2 IMPLANT
YANKAUER SUCT BULB TIP NO VENT (SUCTIONS) ×2 IMPLANT

## 2023-03-25 NOTE — H&P (Signed)
Anesthesia H&P Update: History and Physical Exam reviewed; patient is OK for planned anesthetic and procedure. ? ?

## 2023-03-25 NOTE — H&P (Signed)
H&P documentation  -History and Physical Reviewed  -Patient has been re-examined  -No change in the plan of care  Larry Wilson  

## 2023-03-25 NOTE — Anesthesia Postprocedure Evaluation (Signed)
Anesthesia Post Note  Patient: Larry Wilson  Procedure(s) Performed: DENTAL RESTORATION/EXTRACTIONS     Patient location during evaluation: PACU Anesthesia Type: General Level of consciousness: awake and alert Pain management: pain level controlled Vital Signs Assessment: post-procedure vital signs reviewed and stable Respiratory status: spontaneous breathing, nonlabored ventilation and respiratory function stable Cardiovascular status: blood pressure returned to baseline and stable Postop Assessment: no apparent nausea or vomiting Anesthetic complications: no   No notable events documented.  Last Vitals:  Vitals:   03/25/23 0900 03/25/23 0915  BP: (!) 137/92 (!) 143/96  Pulse: 94 87  Resp: 14 14  Temp:  36.6 C  SpO2: 97% 96%    Last Pain:  Vitals:   03/25/23 0915  TempSrc:   PainSc: 4                  Lowella Curb

## 2023-03-25 NOTE — Op Note (Signed)
03/25/2023  8:21 AM  PATIENT:  Cleotilde Neer  32 y.o. male  PRE-OPERATIVE DIAGNOSIS:  Non-restorable/ impacted teeth # 1, 2, 3, 12, 13, 15, 16, 17, 18, 19, 29, 30, 31, 32  POST-OPERATIVE DIAGNOSIS:  SAME  PROCEDURE:  Procedure(s): EXTRACTION teeth # 1, 2, 3, 12, 13, 15, 16, 17, 18, 19, 29, 30, 31, 32  SURGEON:  Surgeon(s): Ocie Doyne, DMD  ANESTHESIA:   local and general  EBL:  minimal  DRAINS: none   SPECIMEN:  No Specimen  COUNTS:  YES  PLAN OF CARE: Discharge to home after PACU  PATIENT DISPOSITION:  PACU - hemodynamically stable.   PROCEDURE DETAILS: Dictation # [id number]  Georgia Lopes, DMD 03/25/2023 8:21 AM

## 2023-03-25 NOTE — Transfer of Care (Signed)
Immediate Anesthesia Transfer of Care Note  Patient: Larry Wilson  Procedure(s) Performed: DENTAL RESTORATION/EXTRACTIONS  Patient Location: PACU  Anesthesia Type:General  Level of Consciousness: awake, alert , and oriented  Airway & Oxygen Therapy: Patient Spontanous Breathing and Patient connected to face mask oxygen  Post-op Assessment: Report given to RN and Post -op Vital signs reviewed and stable  Post vital signs: Reviewed and stable  Last Vitals:  Vitals Value Taken Time  BP 150/83 03/25/23 0833  Temp    Pulse 106 03/25/23 0835  Resp 16 03/25/23 0835  SpO2 95 % 03/25/23 0835  Vitals shown include unvalidated device data.  Last Pain:  Vitals:   03/25/23 0605  TempSrc:   PainSc: 3          Complications: No notable events documented.

## 2023-03-25 NOTE — Anesthesia Preprocedure Evaluation (Signed)
Anesthesia Evaluation  Patient identified by MRN, date of birth, ID band Patient awake    Reviewed: Allergy & Precautions, H&P , NPO status , Patient's Chart, lab work & pertinent test results  Airway Mallampati: II  TM Distance: >3 FB Neck ROM: Full    Dental  (+) Poor Dentition   Pulmonary neg pulmonary ROS, Current Smoker and Patient abstained from smoking.   Pulmonary exam normal breath sounds clear to auscultation       Cardiovascular negative cardio ROS Normal cardiovascular exam Rhythm:Regular Rate:Normal     Neuro/Psych   Anxiety Depression    negative neurological ROS  negative psych ROS   GI/Hepatic negative GI ROS,,,(+) Hepatitis -  Endo/Other  negative endocrine ROS    Renal/GU negative Renal ROS  negative genitourinary   Musculoskeletal negative musculoskeletal ROS (+)    Abdominal   Peds negative pediatric ROS (+)  Hematology negative hematology ROS (+)   Anesthesia Other Findings   Reproductive/Obstetrics negative OB ROS                             Anesthesia Physical Anesthesia Plan  ASA: 2  Anesthesia Plan: General   Post-op Pain Management: Minimal or no pain anticipated   Induction: Intravenous  PONV Risk Score and Plan: 1 and Ondansetron and Treatment may vary due to age or medical condition  Airway Management Planned: Nasal ETT  Additional Equipment:   Intra-op Plan:   Post-operative Plan: Extubation in OR  Informed Consent: I have reviewed the patients History and Physical, chart, labs and discussed the procedure including the risks, benefits and alternatives for the proposed anesthesia with the patient or authorized representative who has indicated his/her understanding and acceptance.     Dental advisory given  Plan Discussed with: CRNA  Anesthesia Plan Comments:        Anesthesia Quick Evaluation

## 2023-03-25 NOTE — Anesthesia Procedure Notes (Signed)
Procedure Name: Intubation Date/Time: 03/25/2023 7:29 AM  Performed by: Pearson Grippe, CRNAPre-anesthesia Checklist: Patient identified, Emergency Drugs available, Suction available and Patient being monitored Patient Re-evaluated:Patient Re-evaluated prior to induction Oxygen Delivery Method: Circle system utilized Preoxygenation: Pre-oxygenation with 100% oxygen Induction Type: IV induction Ventilation: Mask ventilation without difficulty Laryngoscope Size: Miller and 2 Nasal Tubes: Nasal prep performed, Nasal Rae and Right Tube size: 7.5 mm Placement Confirmation: ETT inserted through vocal cords under direct vision, positive ETCO2 and breath sounds checked- equal and bilateral Tube secured with: Tape Dental Injury: Teeth and Oropharynx as per pre-operative assessment

## 2023-03-25 NOTE — Op Note (Signed)
NAME: Larry Wilson, Larry Wilson MEDICAL RECORD NO: [id number] ACCOUNT NO: [id number] DATE OF BIRTH: 09/28/1991 FACILITY: [facility] LOCATION: [facility] PHYSICIAN: Georgia Lopes, DDS  Operative Report   DATE OF PROCEDURE: 03/25/2023  PREOPERATIVE DIAGNOSIS:  Nonrestorable and impacted teeth 1, 2, 3 12, 13, 15, 16, 17, 18, 19, 29, 30, 31, 32.  POSTOPERATIVE DIAGNOSIS:  Nonrestorable and impacted teeth 1, 2, 3 12, 13, 15, 16, 17, 18, 19, 29, 30, 31, 32.  PROCEDURE:  Extraction of teeth 1, 2, 3, 12, 13, 15, 16, 17, 18, 19, 29, 30, 31, 32.  SURGEON:  Georgia Lopes, DDS  ANESTHESIA:  General, nasal intubation.  Dr. Hyacinth Meeker attending.  DESCRIPTION OF PROCEDURE:  The patient was taken to the operating room and placed on the table in supine position.  General anesthesia was administered and a nasal endotracheal tube was placed and secured.  The eyes were protected and the patient was  draped for surgery.  Timeout was performed.  The posterior pharynx was suctioned and a throat pack was placed.  2% lidocaine 1:100,000 epinephrine was infiltrated in an inferior alveolar block on the right and left sides and in buccal and palatal  infiltration around the maxillary teeth to be removed.  A bite block was placed on the right side of the mouth.  A sweetheart retractor was used to retract the tongue.  A 15 blade was used to make an incision beginning at tooth #17, carried forward both  buccally and lingually in the gingival sulcus around teeth numbers 18 and 19.  The periosteum was reflected.  The teeth were elevated, but could not be removed.  Bone was removed with the Stryker handpiece using a fissure bur under irrigation around the  teeth.  The teeth were then sectioned and removed, sockets were curetted, irrigated and closed with 3-0 chromic.  Then, attention was turned to the left maxilla, the 15 blade was used to make an incision overlying tooth #16 and carried forward in the  buccal and palatal sulcus around tooth  number 15, 12 and 13.  The periosteum was reflected.  Bone was removed to allow for elevation and removal of tooth #16 and then tooth #15 was removed by sectioning and removing inner proximal bone. The roots were  removed individually and then the roots of 12 and 13 were removed individually after removing interproximal bone to allow for luxation of these teeth.  Then, the sockets were curetted, irrigated and closed with 3-0 chromic.  Attention was turned to the  right mandible.  The 15 blade was used to make an incision beginning at tooth #32 carried forward in the buccal and lingual sulcus until tooth #29 was encountered.  The periosteum was reflected.  The teeth were elevated.  Tooth #32 was removed with the  dental forceps.  Tooth #30 and 31 required removal of interproximal bone and tooth #29 required removal of interproximal bone to remove the residual root of this tooth.  Once the teeth were removed, the sockets were curetted, irrigated and closed with  3-0 chromic in the maxilla, the 15 blade was used to make an incision overlying tooth #1 and carried forward to tooth #3 in the buccal and palatal sulcus.  The periosteum was reflected.  The teeth were elevated and removed with the dental forceps.  The  sockets were curetted, irrigated and closed with 3-0 chromic.  The oral cavity was then irrigated and suctioned.  Additional local anesthesia was administered.  Then, the throat pack was removed  and the patient was left under care of anesthesia for  extubation and transport to recovery room with plans for discharge home through day surgery.  ESTIMATED BLOOD LOSS:  Minimum.  COMPLICATIONS:  No complications.  SPECIMENS:  No specimens.  COUNTS:  Correct.   PUS D: 03/25/2023 8:25:58 am T: 03/25/2023 11:06:00 am  JOB: [id number]/ [id number]

## 2023-03-26 ENCOUNTER — Encounter (HOSPITAL_COMMUNITY): Payer: Self-pay | Admitting: Oral Surgery

## 2023-03-28 ENCOUNTER — Other Ambulatory Visit: Payer: Self-pay

## 2023-03-28 DIAGNOSIS — R768 Other specified abnormal immunological findings in serum: Secondary | ICD-10-CM

## 2023-04-15 ENCOUNTER — Telehealth: Payer: Self-pay | Admitting: Family Medicine

## 2023-04-15 NOTE — Telephone Encounter (Signed)
Tried calling back unable to leave a vm.

## 2023-04-15 NOTE — Telephone Encounter (Signed)
[facility] Larry Wilson called about on his after visit summary [phone] has meds listed atomoxetin should patient be taking this medicine. [phone].

## 2023-04-15 NOTE — Telephone Encounter (Signed)
Is not listed in our medication list

## 2023-04-20 ENCOUNTER — Ambulatory Visit: Payer: Medicaid Other | Admitting: Family Medicine

## 2023-04-21 ENCOUNTER — Encounter: Payer: Self-pay | Admitting: Family Medicine

## 2023-04-21 ENCOUNTER — Ambulatory Visit (INDEPENDENT_AMBULATORY_CARE_PROVIDER_SITE_OTHER): Payer: Medicaid Other | Admitting: Family Medicine

## 2023-04-21 VITALS — BP 110/65 | HR 88 | Ht 70.0 in | Wt 190.0 lb

## 2023-04-21 DIAGNOSIS — M25521 Pain in right elbow: Secondary | ICD-10-CM | POA: Insufficient documentation

## 2023-04-21 MED ORDER — PREDNISONE 20 MG PO TABS
20.0000 mg | ORAL_TABLET | Freq: Two times a day (BID) | ORAL | 0 refills | Status: AC
Start: 2023-04-21 — End: 2023-04-26

## 2023-04-21 NOTE — Patient Instructions (Signed)
        Great to see you today.   - Please take medications as prescribed. - Follow up with your primary health provider if any health concerns arises. - If symptoms worsen please contact your primary care provider and/or visit the emergency department.  

## 2023-04-21 NOTE — Assessment & Plan Note (Signed)
Prednisone 20 mg x 5 days Explained to patient Non pharmacological interventions include the use of ice or heat, rest, gentle stretching. The use of NSAIDs for pain management. Follow up for worsening or persistent symptoms. Patient verbalizes understanding regarding plan of care and all questions answered.

## 2023-04-21 NOTE — Progress Notes (Signed)
Patient Office Visit   Subjective   Patient ID: Larry Wilson, male    DOB: May 02, 1991  Age: 32 y.o. MRN: [id number]  CC:  Chief Complaint  Patient presents with   Pain    Patient complains of worsening R elbow pain.     HPI Larry Wilson 32 year old male, presents to the clinic for right elbow pain for about 4 weeks ago.He  has a past medical history of ADHD (attention deficit hyperactivity disorder), Anxiety, Depression, Hepatitis, History of kidney stones, Pneumonia, and Polysubstance abuse (HCC).  Right elbow pain The injury mechanism was repetitive motion. The pain is present in the right elbow. The quality of the pain is described as aching and burning. The pain radiates to the right arm and right hand. The pain is at a severity of 7/10. The pain has been constant since the incident. Associated symptoms include numbness and tingling. The symptoms are aggravated by movement. He has tried NSAIDs and acetaminophen for the symptoms. The treatment provided no relief.      Outpatient Encounter Medications as of 04/21/2023  Medication Sig   acetaminophen (TYLENOL) 500 MG tablet Take 1,000 mg by mouth every 8 (eight) hours as needed for moderate pain.   amoxicillin (AMOXIL) 500 MG capsule Take 1 capsule (500 mg total) by mouth 3 (three) times daily.   gabapentin (NEURONTIN) 300 MG capsule Take 1 capsule (300 mg total) by mouth 3 (three) times daily as needed. (Patient taking differently: Take 300 mg by mouth every morning.)   ibuprofen (ADVIL) 800 MG tablet Take 1 tablet (800 mg total) by mouth every 8 (eight) hours as needed.   methocarbamol (ROBAXIN) 500 MG tablet Take 1 tablet (500 mg total) by mouth once as needed for up to 1 dose for muscle spasms.   predniSONE (DELTASONE) 20 MG tablet Take 1 tablet (20 mg total) by mouth 2 (two) times daily with breakfast and lunch for 5 days.   QUEtiapine (SEROQUEL) 100 MG tablet Take 200 mg by mouth at bedtime.   Sofosbuvir-Velpatasvir  (EPCLUSA) 400-100 MG TABS Take 1 tablet by mouth daily.   No facility-administered encounter medications on file as of 04/21/2023.    Past Surgical History:  Procedure Laterality Date   none     TOOTH EXTRACTION N/A 03/25/2023   Procedure: DENTAL RESTORATION/EXTRACTIONS;  Surgeon: Ocie Doyne, DMD;  Location: [facility] OR;  Service: Oral Surgery;  Laterality: N/A;    Review of Systems  Constitutional:  Negative for chills and fever.  Respiratory:  Negative for shortness of breath.   Cardiovascular:  Negative for chest pain.  Genitourinary:  Negative for dysuria.  Musculoskeletal:  Positive for joint pain and myalgias.  Neurological:  Negative for dizziness and headaches.      Objective    BP 110/65   Pulse 88   Ht 5\' 10"  (1.778 m)   Wt 190 lb (86.2 kg)   SpO2 97%   BMI 27.26 kg/m   Physical Exam Vitals reviewed.  Constitutional:      General: He is not in acute distress.    Appearance: Normal appearance. He is not ill-appearing, toxic-appearing or diaphoretic.  HENT:     Head: Normocephalic.  Eyes:     General:        Right eye: No discharge.        Left eye: No discharge.     Conjunctiva/sclera: Conjunctivae normal.  Cardiovascular:     Rate and Rhythm: Normal rate.  Pulses: Normal pulses.     Heart sounds: Normal heart sounds.  Pulmonary:     Effort: Pulmonary effort is normal. No respiratory distress.     Breath sounds: Normal breath sounds.  Musculoskeletal:        General: Swelling and tenderness present.     Right elbow: Swelling present. No deformity. Decreased range of motion. Tenderness present.     Left elbow: Normal. No swelling or deformity. Normal range of motion. No tenderness.     Cervical back: Normal range of motion.  Skin:    General: Skin is warm and dry.     Capillary Refill: Capillary refill takes less than 2 seconds.  Neurological:     General: No focal deficit present.     Mental Status: He is alert and oriented to person, place, and time.      Coordination: Coordination normal.     Gait: Gait normal.  Psychiatric:        Mood and Affect: Mood normal.        Behavior: Behavior normal.        Thought Content: Thought content normal.       Assessment & Plan:  Right elbow pain Assessment & Plan: Prednisone 20 mg x 5 days Explained to patient Non pharmacological interventions include the use of ice or heat, rest, gentle stretching. The use of NSAIDs for pain management. Follow up for worsening or persistent symptoms. Patient verbalizes understanding regarding plan of care and all questions answered.   Orders: -     predniSONE; Take 1 tablet (20 mg total) by mouth 2 (two) times daily with breakfast and lunch for 5 days.  Dispense: 10 tablet; Refill: 0 -     Elbow splint    Return if symptoms worsen or fail to improve.   Cruzita Lederer Newman Nip, FNP

## 2023-05-11 ENCOUNTER — Telehealth: Payer: Self-pay | Admitting: Family Medicine

## 2023-05-11 NOTE — Telephone Encounter (Signed)
Kai w. [facility] called in needs verification on   gabapentin (NEURONTIN) 300 MG capsule

## 2023-05-11 NOTE — Telephone Encounter (Signed)
Returned call, no answer and no VM. Patient has not had any refills or changes to scripts since 3/29.

## 2023-05-27 ENCOUNTER — Telehealth: Payer: Self-pay | Admitting: Family Medicine

## 2023-05-27 ENCOUNTER — Ambulatory Visit: Payer: Medicaid Other | Admitting: Family Medicine

## 2023-05-27 NOTE — Telephone Encounter (Signed)
Kia Called from [facility]  [phone] left same message on 05.22.2024 the Gabapentin was sent in on  03.29.2024 and was sent to tid but sent a hand written note no more than once daily. Needs clarification should it be tid or once a day. Call back # [phone]

## 2023-05-27 NOTE — Telephone Encounter (Signed)
error 

## 2023-05-27 NOTE — Telephone Encounter (Signed)
Called [facility] again, left VM. Have not sent a refill recently.

## 2023-07-18 ENCOUNTER — Other Ambulatory Visit: Payer: Self-pay

## 2023-07-18 DIAGNOSIS — R768 Other specified abnormal immunological findings in serum: Secondary | ICD-10-CM

## 2023-09-12 ENCOUNTER — Ambulatory Visit: Payer: Self-pay | Admitting: Physician Assistant

## 2024-12-20 ENCOUNTER — Encounter: Payer: Self-pay | Admitting: Gastroenterology
